# Patient Record
Sex: Male | Born: 1965 | Race: White | Hispanic: No | Marital: Married | Smoking: Former smoker
Health system: Southern US, Community
[De-identification: ages and names within clinical notes are randomized; demographics above are authoritative.]

## PROBLEM LIST (undated history)

## (undated) DIAGNOSIS — M722 Plantar fascial fibromatosis: Secondary | ICD-10-CM

## (undated) DIAGNOSIS — K5792 Diverticulitis of intestine, part unspecified, without perforation or abscess without bleeding: Secondary | ICD-10-CM

## (undated) DIAGNOSIS — I10 Essential (primary) hypertension: Secondary | ICD-10-CM

## (undated) DIAGNOSIS — G4730 Sleep apnea, unspecified: Secondary | ICD-10-CM

## (undated) DIAGNOSIS — E119 Type 2 diabetes mellitus without complications: Secondary | ICD-10-CM

## (undated) HISTORY — PX: DENTAL SURGERY: SHX609

## (undated) HISTORY — PX: FOOT SURGERY: SHX648

## (undated) HISTORY — DX: Plantar fascial fibromatosis: M72.2

## (undated) HISTORY — DX: Type 2 diabetes mellitus without complications: E11.9

## (undated) HISTORY — PX: OTHER SURGICAL HISTORY: SHX169

---

## 2001-03-02 ENCOUNTER — Ambulatory Visit (HOSPITAL_COMMUNITY): Admission: RE | Admit: 2001-03-02 | Discharge: 2001-03-02 | Payer: Self-pay | Admitting: *Deleted

## 2008-02-02 ENCOUNTER — Ambulatory Visit (HOSPITAL_COMMUNITY): Admission: RE | Admit: 2008-02-02 | Discharge: 2008-02-03 | Payer: Self-pay | Admitting: Orthopaedic Surgery

## 2008-09-17 ENCOUNTER — Ambulatory Visit: Payer: Self-pay | Admitting: Psychiatry

## 2008-09-18 ENCOUNTER — Encounter: Admission: RE | Admit: 2008-09-18 | Discharge: 2008-10-15 | Payer: Self-pay | Admitting: Family Medicine

## 2008-10-01 ENCOUNTER — Ambulatory Visit: Payer: Self-pay | Admitting: Psychiatry

## 2008-10-15 ENCOUNTER — Ambulatory Visit: Payer: Self-pay | Admitting: Psychiatry

## 2008-10-19 ENCOUNTER — Encounter: Admission: RE | Admit: 2008-10-19 | Discharge: 2008-10-19 | Payer: Self-pay | Admitting: Family Medicine

## 2008-10-29 ENCOUNTER — Ambulatory Visit: Payer: Self-pay | Admitting: Psychiatry

## 2008-11-12 ENCOUNTER — Ambulatory Visit: Payer: Self-pay | Admitting: Psychiatry

## 2008-11-26 ENCOUNTER — Ambulatory Visit: Payer: Self-pay | Admitting: Psychiatry

## 2011-04-28 NOTE — Op Note (Signed)
NAME:  Dennis Sims, Dennis Sims NO.:  [id number]   MEDICAL RECORD NO.:  [id number]          PATIENT TYPE:  AMB   LOCATION:  SDS                          FACILITY:  [facility]   PHYSICIAN:  Lubertha Basque. Dalldorf, M.D.DATE OF BIRTH:  04/12/1966   DATE OF PROCEDURE:  02/02/2008  DATE OF DISCHARGE:                               OPERATIVE REPORT   PREOPERATIVE DIAGNOSIS:  Bilateral plantar fasciitis.   POSTOPERATIVE DIAGNOSIS:  Bilateral plantar fasciitis.   PROCEDURE:  Bilateral endoscopic plantar fascial release with Topaz.   ANESTHESIA:  General.   ATTENDING SURGEON:  Lubertha Basque. Jerl Santos, M.D.   ASSISTANT:  Lindwood Qua, P.A.   INDICATIONS FOR PROCEDURE:  The patient is a 45 year old man with years  of bilateral heel pain.  This has persisted despite conservative  measures including supervised physical therapy, night splinting,  stretching, and multiple injections.  He has pain which limits his  ability to remain active.  He is offered endoscopic plantar fascial  release with Topaz.  Informed operative consent was obtained after  discussion of possible complications of reaction to anesthesia,  infection, neurovascular injury.   SUMMARY OF FINDINGS AND PROCEDURE:  Under general anesthesia through  endoscopic approaches to both ankles, bilateral plantar fascial surgery  was performed.  We used the Topaz device on both sides to help stimulate  angiogenesis.  A partial release was done from the medial aspect as  well.   DESCRIPTION OF PROCEDURE:  The patient was taken to the operating suite  where general anesthetic was applied without difficulty.  He was  positioned supine and prepped and draped in normal sterile fashion.  After the administration of IV Kefzol, the left ankle was addressed  initially.  Small medial incision was made near the plantar fascial  origin with blunt dissection down to this structure.  A trocar was  placed above this structure and placed out the  heel on the lateral  aspect through a separate stab wound.  The plantar fascia was then  visualized with an endoscope.  We then used the Topaz device to perform  stimulation of this fascia in proximally 20 areas with the probe in  hopes that this would cause some angiogenesis.  I then removed the  arthroscopic equipment.  We placed a small Metzenbaum scissors and  released the medial edge of the plantar fascial origin.  The foot was  irrigated, followed by reapproximation of the wounds loosely with nylon.  We then addressed the right heel in an identical fashion.  Both heels  were dressed with Adaptic and dry gauze and loose Ace wraps.  Estimated  loss and intraoperative fluids can be obtained from anesthesia records.  No tourniquets were placed or utilized.   DISPOSITION:  The patient was extubated in the operating room and taken  to recovery in stable addition.  He was to stay overnight to monitor his  sleep apnea with probable discharge home in the morning.      Lubertha Basque Jerl Santos, M.D.  Electronically Signed     PGD/MEDQ  D:  02/02/2008  T:  02/03/2008  Job:  [id number]

## 2011-04-28 NOTE — Discharge Summary (Signed)
NAME:  Dennis Sims, Dennis Sims NO.:  [id number]   MEDICAL RECORD NO.:  [id number]          PATIENT TYPE:  OIB   LOCATION:  5016                         FACILITY:  [facility]   PHYSICIAN:  Lubertha Basque. Dalldorf, M.D.DATE OF BIRTH:  [birth year]   DATE OF ADMISSION:  02/02/2008  DATE OF DISCHARGE:  02/03/2008                               DISCHARGE SUMMARY   ADMITTING DIAGNOSES:  1. Bilateral plantar fascial chronic pain.  2. Sleep apnea.   DISCHARGE DIAGNOSES:  1. Bilateral plantar fascial chronic pain.  2. Sleep apnea.   OPERATION:  Bilateral plantar fascial release endoscopic.   BRIEF HISTORY:  This is a 45 year old white male patient well-known to  our practice who Dr. Eula Listen had been treating conservatively  for bilateral plantar fasciitis.  He had done oral anti-inflammatory  medicines, orthotics, injections, physical therapy.  The patient failed  all of these and had continued discomfort with standing and walking and  pain with initial weightbearing.  X-rays revealed small spurs on the  plantar fascia and the calcaneus.  Discussed treatment options with the  patient that being plantar fascial releases bilaterally endoscopically.   PERTINENT LABORATORY AND X-RAY FINDINGS:  WBC 9.9, hemoglobin 14.8.   HOSPITAL COURSE:  He was admitted postoperatively and placed on a  variety of p.o. and IM analgesics for pain IV.  PCA Dilaudid pump was  used for pain control.  IV Ancef a gram q.8 h. x3 doses.  The first  morning postop blood pressure 109/64, temperature 97.  Lungs were clear.  Abdomen was soft.  Wounds were benign.  Dressing was changed bilaterally  but he had not gotten out of bed and walked.  We discussed treatment  options in addition with him and that was to have physical therapy walk  him, stand him up with a walker and crutches and then he was discharged.   CONDITION ON DISCHARGE:  Improved.   FOLLOWUP CARE:  He will have a low-sodium/heart-healthy diet.   May  change his dressings daily.  He was given a prescription for Vicodin one  or two q.4-6 h. p.r.n. pain.  Any sign of infection to call [phone],  ice, elevation.  Follow up with Dr. Jerl Santos in about 2 weeks.      Lindwood Qua, P.A.      Lubertha Basque Jerl Santos, M.D.  Electronically Signed    MC/MEDQ  D:  02/03/2008  T:  02/03/2008  Job:  [id number]

## 2011-09-04 LAB — CBC
HCT: 43
Hemoglobin: 14.8
MCHC: 34.5
MCV: 96.6
Platelets: 176
RBC: 4.45
RDW: 12.9
WBC: 9.9

## 2011-10-02 ENCOUNTER — Other Ambulatory Visit: Payer: Self-pay | Admitting: Family Medicine

## 2011-10-02 ENCOUNTER — Ambulatory Visit
Admission: RE | Admit: 2011-10-02 | Discharge: 2011-10-02 | Disposition: A | Discharge status: Home / Self Care | Payer: Private Health Insurance - Indemnity | Source: Ambulatory Visit | Attending: Family Medicine | Admitting: Family Medicine

## 2011-10-02 DIAGNOSIS — R1032 Left lower quadrant pain: Secondary | ICD-10-CM

## 2011-10-02 MED ORDER — IOHEXOL 300 MG/ML  SOLN
125.0000 mL | Freq: Once | INTRAMUSCULAR | Status: AC | PRN
  Administered 2011-10-02: 125 mL via INTRAVENOUS

## 2011-10-07 ENCOUNTER — Other Ambulatory Visit: Payer: Self-pay | Admitting: Family Medicine

## 2011-10-07 DIAGNOSIS — R93429 Abnormal radiologic findings on diagnostic imaging of unspecified kidney: Secondary | ICD-10-CM

## 2011-10-07 DIAGNOSIS — N281 Cyst of kidney, acquired: Secondary | ICD-10-CM

## 2011-10-13 ENCOUNTER — Ambulatory Visit
Admission: RE | Admit: 2011-10-13 | Discharge: 2011-10-13 | Disposition: A | Discharge status: Home / Self Care | Payer: Private Health Insurance - Indemnity | Source: Ambulatory Visit | Attending: Family Medicine | Admitting: Family Medicine

## 2011-10-13 DIAGNOSIS — R93429 Abnormal radiologic findings on diagnostic imaging of unspecified kidney: Secondary | ICD-10-CM

## 2011-10-13 DIAGNOSIS — N281 Cyst of kidney, acquired: Secondary | ICD-10-CM

## 2011-10-13 MED ORDER — GADOBENATE DIMEGLUMINE 529 MG/ML IV SOLN
20.0000 mL | Freq: Once | INTRAVENOUS | Status: AC | PRN
  Administered 2011-10-13: 20 mL via INTRAVENOUS

## 2011-11-10 ENCOUNTER — Ambulatory Visit
Admission: RE | Admit: 2011-11-10 | Discharge: 2011-11-10 | Disposition: A | Discharge status: Home / Self Care | Payer: Private Health Insurance - Indemnity | Source: Ambulatory Visit | Attending: Family Medicine | Admitting: Family Medicine

## 2011-11-10 ENCOUNTER — Other Ambulatory Visit: Payer: Self-pay | Admitting: Family Medicine

## 2011-11-10 DIAGNOSIS — R1032 Left lower quadrant pain: Secondary | ICD-10-CM

## 2011-11-10 MED ORDER — IOHEXOL 300 MG/ML  SOLN
125.0000 mL | Freq: Once | INTRAMUSCULAR | Status: AC | PRN
  Administered 2011-11-10: 125 mL via INTRAVENOUS

## 2012-03-16 ENCOUNTER — Other Ambulatory Visit: Payer: Self-pay | Admitting: Family Medicine

## 2012-03-16 DIAGNOSIS — N281 Cyst of kidney, acquired: Secondary | ICD-10-CM

## 2012-06-17 ENCOUNTER — Ambulatory Visit: Payer: Private Health Insurance - Indemnity | Admitting: Internal Medicine

## 2012-06-17 VITALS — BP 150/74 | HR 73 | Temp 98.3°F | Resp 16 | Ht 77.0 in | Wt 278.2 lb

## 2012-06-17 DIAGNOSIS — H6090 Unspecified otitis externa, unspecified ear: Secondary | ICD-10-CM

## 2012-06-17 DIAGNOSIS — H60399 Other infective otitis externa, unspecified ear: Secondary | ICD-10-CM

## 2012-06-17 MED ORDER — NEOMYCIN-POLYMYXIN-HC 3.5-10000-1 OT SOLN: 3.0000 [drp] | Freq: Four times a day (QID) | OTIC | Status: AC

## 2012-06-17 MED ORDER — AMOXICILLIN-POT CLAVULANATE 875-125 MG PO TABS: 1.0000 | ORAL_TABLET | Freq: Two times a day (BID) | ORAL | Status: AC

## 2012-06-17 NOTE — Progress Notes (Signed)
  Subjective:    Patient ID: Dennis Sims, male    DOB: 1966-02-24, 46 y.o.   MRN: [id number]  HPIComplaining of ear pain for 2 days/hurts to push on ear lobe/no fever/No nasal congestion    Review of Systems     Objective:   Physical Exam Right ear normal Left ear has swollen canal with tender tragus pull and exudate throughout the canal        Assessment & Plan:  External otitis  Cortisporin Otic/Augmentin

## 2012-08-18 ENCOUNTER — Ambulatory Visit (INDEPENDENT_AMBULATORY_CARE_PROVIDER_SITE_OTHER): Payer: Private Health Insurance - Indemnity | Admitting: Emergency Medicine

## 2012-08-18 VITALS — BP 159/89 | HR 83 | Temp 98.0°F | Resp 16 | Ht 75.5 in | Wt 300.0 lb

## 2012-08-18 DIAGNOSIS — H04309 Unspecified dacryocystitis of unspecified lacrimal passage: Secondary | ICD-10-CM

## 2012-08-18 MED ORDER — CIPROFLOXACIN HCL 0.3 % OP SOLN: 1.0000 [drp] | OPHTHALMIC | Status: AC

## 2012-08-18 NOTE — Progress Notes (Signed)
   Date:  08/18/2012   Name:  Dennis Sims   DOB:  Jan 09, 1966   MRN:  [id number] Gender: male Age: 46 y.o.  PCP:  No primary provider on file.    Chief Complaint: Eye Pain   History of Present Illness:  Dennis Sims is a 46 y.o. pleasant patient who presents with the following:  Had a sore "bump;" on his right lower lid yesterday and put clearasil on it and used his CPAP.   This morning awoke and his lower lid was inflammed and red.  No drainage or gluing.  No coryza.  No fever or chills or visual symptoms.   There is no problem list on file for this patient.   No past medical history on file.  No past surgical history on file.  History  Substance Use Topics  . Smoking status: Former Smoker -- 1.0 packs/day for 15 years    Types: Cigarettes    Quit date: 07/13/2012  . Smokeless tobacco: Not on file  . Alcohol Use: Not on file     BEER AND ALCOHOL    No family history on file.  Allergies  Allergen Reactions  . Oxycodone Rash    ITCHING AND UNABLE TO SLEEP        Medication list has been reviewed and updated.  Current Outpatient Prescriptions on File Prior to Visit  Medication Sig Dispense Refill  . Zolpidem Tartrate (AMBIEN PO) Take 10 mg by mouth at bedtime as needed.         Review of Systems: As per HPI, otherwise negative.    Physical Examination: Filed Vitals:   08/18/12 1451  BP: 159/89  Pulse: 83  Temp: 98 F (36.7 C)  Resp: 16   Filed Vitals:   08/18/12 1451  Height: 6' 3.5" (1.918 m)  Weight: 300 lb (136.079 kg)   Body mass index is 37.00 kg/(m^2). Ideal Body Weight: Weight in (lb) to have BMI = 25: 202.3   .GEN: WDWN, NAD, Non-toxic, Alert & Oriented x 3 HEENT: Atraumatic, Normocephalic. PRRERLA EOMI  Conjunctiva and scle Ears and Nose: No external deformity. EXTR: No clubbing/cyanosis/edema NEURO: Normal gait.  PSYCH: Normally interactive. Conversant. Not depressed or anxious appearing.  Calm demeanor.     Assessment and  Plan:  dacryocystitis Ciloxan Follow up as needed  Carmelina Dane, MD

## 2013-04-30 ENCOUNTER — Ambulatory Visit (INDEPENDENT_AMBULATORY_CARE_PROVIDER_SITE_OTHER): Payer: BC Managed Care – PPO | Admitting: Physician Assistant

## 2013-04-30 VITALS — BP 140/100 | HR 88 | Temp 98.5°F | Resp 18 | Ht 75.5 in | Wt 324.0 lb

## 2013-04-30 DIAGNOSIS — R05 Cough: Secondary | ICD-10-CM

## 2013-04-30 DIAGNOSIS — G4700 Insomnia, unspecified: Secondary | ICD-10-CM

## 2013-04-30 DIAGNOSIS — R059 Cough, unspecified: Secondary | ICD-10-CM

## 2013-04-30 DIAGNOSIS — J40 Bronchitis, not specified as acute or chronic: Secondary | ICD-10-CM

## 2013-04-30 MED ORDER — AZITHROMYCIN 250 MG PO TABS: ORAL_TABLET | ORAL | Status: DC

## 2013-04-30 MED ORDER — ZOLPIDEM TARTRATE 10 MG PO TABS: 10.0000 mg | ORAL_TABLET | Freq: Every evening | ORAL | Status: DC | PRN

## 2013-04-30 MED ORDER — BENZONATATE 100 MG PO CAPS: 100.0000 mg | ORAL_CAPSULE | Freq: Three times a day (TID) | ORAL | Status: DC | PRN

## 2013-04-30 MED ORDER — GUAIFENESIN-CODEINE 100-10 MG/5ML PO SOLN: ORAL | Status: DC

## 2013-04-30 NOTE — Progress Notes (Signed)
Patient ID: DEVONTAY CELAYA MRN: [id number], DOB: [birth year], 47 y.o. Date of Encounter: 04/30/2013, 5:35 PM  Primary Physician: Neldon Labella, MD  Chief Complaint:  Chief Complaint  Patient presents with  . Cough    * 4 days- productive in the AM  . Medication Refill    Ambien    HPI: 47 y.o. male presents with a 4 day history of nasal congestion, post nasal drip, sore throat, and cough. Mild sinus pressure. Afebrile. No chills. Nasal congestion thick and green/yellow. Cough is mildly productive of green/yellow sputum and not associated with time of day. No shortness of breath or wheezing. No increase in pillow usage. Uses two pillows and has for years. No lower extremity swelling. Has tried OTC cold preps without success. No GI complaints. Appetite normal. Still smoking. Not yet ready to quit. No sick contacts, recent antibiotics, or recent travels. No leg trauma, sedentary periods, or h/o cancer.  He also requests a refill on Ambien 10 mg. Does not take regularly. Usually only takes if he has a big meeting the next morning.   No past medical history on file.   Home Meds: Prior to Admission medications   Medication Sig Start Date End Date Taking? Authorizing Provider                                Allergies:  Allergies  Allergen Reactions  . Oxycodone Rash    ITCHING AND UNABLE TO SLEEP        History   Social History  . Marital Status: Married    Spouse Name: N/A    Number of Children: N/A  . Years of Education: N/A   Occupational History  . Not on file.   Social History Main Topics  . Smoking status: Current Some Day Smoker -- 1.00 packs/day for 15 years    Types: Cigarettes    Last Attempt to Quit: 07/13/2012  . Smokeless tobacco: Not on file  . Alcohol Use: Yes     Comment: BEER AND ALCOHOL  . Drug Use: No  . Sexually Active: Not on file   Other Topics Concern  . Not on file   Social History Narrative  . No narrative on file     Review of  Systems: Constitutional: negative for chills, fever, night sweats or weight changes HEENT: see above Cardiovascular: negative for chest pain or palpitations Respiratory: positive for cough. negative for hemoptysis, wheezing, or shortness of breath Abdominal: negative for abdominal pain, nausea, vomiting or diarrhea Dermatological: negative for rash Neurologic: negative for headache   Physical Exam: Blood pressure 140/100, pulse 88, temperature 98.5 F (36.9 C), temperature source Oral, resp. rate 18, height 6' 3.5" (1.918 m), weight 324 lb (146.965 kg), SpO2 96.00%., Body mass index is 39.95 kg/(m^2). General: Well developed, well nourished, in no acute distress. Head: Normocephalic, atraumatic, eyes without discharge, sclera non-icteric, nares are congested. Bilateral auditory canals clear, TM's are without perforation, pearly grey with reflective cone of light bilaterally. No sinus TTP. Oral cavity moist, dentition normal. Posterior pharynx with post nasal drip and mild erythema. No peritonsillar abscess or tonsillar exudate. Uvula midline. Neck: Supple. No thyromegaly. Full ROM. No lymphadenopathy. Lungs: Coarse breath sounds bilaterally without wheezes, rales, or rhonchi. Breathing is unlabored.  Heart: RRR with S1 S2. No murmurs, rubs, or gallops appreciated. Msk:  Strength and tone normal for age. Extremities: No clubbing or cyanosis. No edema. Neuro: Alert  and oriented X 3. Moves all extremities spontaneously. CNII-XII grossly in tact. Psych:  Responds to questions appropriately with a normal affect.     ASSESSMENT AND PLAN:  47 y.o. male with bronchitis, cough, and insomnia 1) Bronchitis and cough -Azithromycin 250 MG #6 2 po first day then 1 po next 4 days no RF -Guaifenesin/codeine 100/10 mg/79mL 1 tsp po q 4-6 hours prn cough #120 mL no RF, SED -Tessalon Perles 100 mg 1 po tid prn cough #30 no RF  -Mucinex -Tylenol/Motrin prn -Stop smoking -Rest/fluids -RTC  precautions -RTC 3-5 days if no improvement  2) Insomnia -Ambien 10 mg 1 po qhs #30 no RF -May call for further refills  Signed, Eula Listen, PA-C 04/30/2013 5:35 PM

## 2013-09-14 ENCOUNTER — Other Ambulatory Visit: Payer: Self-pay | Admitting: Physician Assistant

## 2013-09-14 DIAGNOSIS — R319 Hematuria, unspecified: Secondary | ICD-10-CM

## 2013-09-15 ENCOUNTER — Ambulatory Visit
Admission: RE | Admit: 2013-09-15 | Discharge: 2013-09-15 | Disposition: A | Discharge status: Home / Self Care | Payer: BC Managed Care – PPO | Source: Ambulatory Visit | Attending: Physician Assistant | Admitting: Physician Assistant

## 2013-09-15 DIAGNOSIS — R319 Hematuria, unspecified: Secondary | ICD-10-CM

## 2013-10-06 ENCOUNTER — Other Ambulatory Visit: Payer: Self-pay | Admitting: Gastroenterology

## 2013-10-06 DIAGNOSIS — R109 Unspecified abdominal pain: Secondary | ICD-10-CM

## 2013-10-11 ENCOUNTER — Ambulatory Visit
Admission: RE | Admit: 2013-10-11 | Discharge: 2013-10-11 | Disposition: A | Discharge status: Home / Self Care | Payer: BC Managed Care – PPO | Source: Ambulatory Visit | Attending: Gastroenterology | Admitting: Gastroenterology

## 2013-10-11 DIAGNOSIS — R109 Unspecified abdominal pain: Secondary | ICD-10-CM

## 2013-10-11 MED ORDER — IOHEXOL 300 MG/ML  SOLN
125.0000 mL | Freq: Once | INTRAMUSCULAR | Status: AC | PRN
  Administered 2013-10-11: 125 mL via INTRAVENOUS

## 2013-11-19 ENCOUNTER — Emergency Department (HOSPITAL_COMMUNITY)
Admission: EM | Admit: 2013-11-19 | Discharge: 2013-11-19 | Disposition: A | Discharge status: Home / Self Care | Payer: BC Managed Care – PPO | Attending: Emergency Medicine | Admitting: Emergency Medicine

## 2013-11-19 ENCOUNTER — Emergency Department (HOSPITAL_COMMUNITY): Payer: BC Managed Care – PPO

## 2013-11-19 ENCOUNTER — Encounter (HOSPITAL_COMMUNITY): Payer: Self-pay | Admitting: Emergency Medicine

## 2013-11-19 DIAGNOSIS — R6883 Chills (without fever): Secondary | ICD-10-CM | POA: Insufficient documentation

## 2013-11-19 DIAGNOSIS — R197 Diarrhea, unspecified: Secondary | ICD-10-CM | POA: Insufficient documentation

## 2013-11-19 DIAGNOSIS — R112 Nausea with vomiting, unspecified: Secondary | ICD-10-CM | POA: Insufficient documentation

## 2013-11-19 DIAGNOSIS — Z8719 Personal history of other diseases of the digestive system: Secondary | ICD-10-CM | POA: Insufficient documentation

## 2013-11-19 DIAGNOSIS — R109 Unspecified abdominal pain: Secondary | ICD-10-CM

## 2013-11-19 DIAGNOSIS — R1031 Right lower quadrant pain: Secondary | ICD-10-CM | POA: Insufficient documentation

## 2013-11-19 DIAGNOSIS — R1032 Left lower quadrant pain: Secondary | ICD-10-CM | POA: Insufficient documentation

## 2013-11-19 DIAGNOSIS — G4733 Obstructive sleep apnea (adult) (pediatric): Secondary | ICD-10-CM | POA: Insufficient documentation

## 2013-11-19 DIAGNOSIS — F17200 Nicotine dependence, unspecified, uncomplicated: Secondary | ICD-10-CM | POA: Insufficient documentation

## 2013-11-19 HISTORY — DX: Sleep apnea, unspecified: G47.30

## 2013-11-19 HISTORY — DX: Diverticulitis of intestine, part unspecified, without perforation or abscess without bleeding: K57.92

## 2013-11-19 LAB — COMPREHENSIVE METABOLIC PANEL
AST: 20 U/L (ref 0–37)
Albumin: 3.9 g/dL (ref 3.5–5.2)
Alkaline Phosphatase: 53 U/L (ref 39–117)
BUN: 17 mg/dL (ref 6–23)
CO2: 23 mEq/L (ref 19–32)
Chloride: 100 mEq/L (ref 96–112)
Creatinine, Ser: 1.34 mg/dL (ref 0.50–1.35)
GFR calc non Af Amer: 61 mL/min — ABNORMAL LOW (ref >90)
Potassium: 3.6 mEq/L (ref 3.5–5.1)
Total Bilirubin: 1.9 mg/dL — ABNORMAL HIGH (ref 0.3–1.2)

## 2013-11-19 LAB — CBC WITH DIFFERENTIAL/PLATELET
Basophils Absolute: 0 10*3/uL (ref 0.0–0.1)
Basophils Relative: 0 % (ref 0–1)
Eosinophils Absolute: 0.1 10*3/uL (ref 0.0–0.7)
Eosinophils Relative: 1 % (ref 0–5)
MCH: 34.2 pg — ABNORMAL HIGH (ref 26.0–34.0)
MCV: 95 fL (ref 78.0–100.0)
Neutrophils Relative %: 80 % — ABNORMAL HIGH (ref 43–77)
Platelets: 123 10*3/uL — ABNORMAL LOW (ref 150–400)
RBC: 4.83 MIL/uL (ref 4.22–5.81)
RDW: 12.4 % (ref 11.5–15.5)

## 2013-11-19 MED ORDER — HYDROCODONE-ACETAMINOPHEN 7.5-325 MG PO TABS: 1.0000 | ORAL_TABLET | Freq: Four times a day (QID) | ORAL | Status: DC | PRN

## 2013-11-19 MED ORDER — TRAMADOL HCL 50 MG PO TABS: 50.0000 mg | ORAL_TABLET | Freq: Four times a day (QID) | ORAL | Status: DC | PRN

## 2013-11-19 MED ORDER — FENTANYL CITRATE 0.05 MG/ML IJ SOLN
100.0000 ug | Freq: Once | INTRAMUSCULAR | Status: AC
  Administered 2013-11-19: 100 ug via INTRAVENOUS
  Filled 2013-11-19: qty 2

## 2013-11-19 MED ORDER — ONDANSETRON HCL 4 MG/2ML IJ SOLN
4.0000 mg | Freq: Once | INTRAMUSCULAR | Status: AC
  Administered 2013-11-19: 4 mg via INTRAVENOUS
  Filled 2013-11-19: qty 2

## 2013-11-19 NOTE — ED Notes (Signed)
I gave patient urinal he will call when he goes

## 2013-11-19 NOTE — ED Notes (Signed)
Patient does not have to urinate, he says he is too weak.

## 2013-11-19 NOTE — ED Provider Notes (Signed)
I saw and evaluated the patient, reviewed the resident's note and I agree with the findings and plan.   .Face to face Exam:  General:  Awake HEENT:  Atraumatic Resp:  Normal effort Abd:  Nondistended Neuro:No focal weakness On  Nelia Shi, MD 11/19/13 1620

## 2013-11-19 NOTE — ED Provider Notes (Signed)
CSN: [id number]     Arrival date & time 11/19/13  1324 History   First MD Initiated Contact with Patient 11/19/13 1356     No chief complaint on file.  (Consider location/radiation/quality/duration/timing/severity/associated sxs/prior Treatment) HPI Comments: 47 year old male with a PMH of diverticular disease and OSA presenting with LLQ pain.  He describes sharp pain that worsened in intensity after a colonoscopy two days ago.  He had two polyps removed during the procedure.  He has been eating but vomited the night of the procedure and again last night.  He reports loose, non-bloody bowel movements.  He also reports chills the past two nights.  He contacted his gastroenterologist's office and was advised to go to the ED.        Past Medical History  Diagnosis Date  . Sleep apnea   . Diverticulitis    Past Surgical History  Procedure Laterality Date  . Colonscopy    . Dental surgery    . Foot surgery     No family history on file. History  Substance Use Topics  . Smoking status: Current Some Day Smoker -- 1.00 packs/day for 15 years    Types: Cigarettes    Last Attempt to Quit: 07/13/2012  . Smokeless tobacco: Not on file  . Alcohol Use: Yes     Comment: BEER AND ALCOHOL    Review of Systems  Constitutional: Positive for chills.  Respiratory: Negative for shortness of breath.   Cardiovascular: Negative for chest pain.  Gastrointestinal: Positive for nausea, vomiting and abdominal pain. Negative for constipation and blood in stool.  Genitourinary: Negative for dysuria and hematuria.    Allergies  Oxycodone  Home Medications   Current Outpatient Rx  Name  Route  Sig  Dispense  Refill  . acetaminophen (TYLENOL) 500 MG tablet   Oral   Take 1,000 mg by mouth every 6 (six) hours as needed.         . zolpidem (AMBIEN) 10 MG tablet   Oral   Take 10 mg by mouth at bedtime as needed for sleep.         . polyethylene glycol-electrolytes (NULYTELY/GOLYTELY) 420 G  solution   Oral   Take 4,000 mLs by mouth once.         Marland Kitchen EXPIRED: zolpidem (AMBIEN) 10 MG tablet   Oral   Take 1 tablet (10 mg total) by mouth at bedtime as needed for sleep.   30 tablet   0    BP 118/72  Pulse 66  Temp(Src) 98.1 F (36.7 C) (Oral)  Resp 24  SpO2 94% Physical Exam  Constitutional: He is oriented to person, place, and time. He appears well-developed and well-nourished. No distress.  HENT:  Head: Normocephalic and atraumatic.  Mouth/Throat: Oropharynx is clear and moist. No oropharyngeal exudate.  Eyes: EOM are normal. Pupils are equal, round, and reactive to light. No scleral icterus.  Neck: Neck supple.  Cardiovascular: Normal rate, regular rhythm and normal heart sounds.   Pulmonary/Chest: Effort normal and breath sounds normal. No respiratory distress. He has no wheezes. He has no rales.  Abdominal: Soft. Bowel sounds are normal. He exhibits no distension. There is tenderness. There is no rebound and no guarding.  Patient tolerated gurney being rocked back and forth.  + lower abdomen (LLQ > RLQ) extremely tender to light palpation.  Musculoskeletal: Normal range of motion. He exhibits no edema and no tenderness.  Neurological: He is alert and oriented to person, place, and time.  Skin: Skin is warm. He is not diaphoretic.  Psychiatric: He has a normal mood and affect. His behavior is normal.    ED Course  Procedures (including critical care time) Labs Review Labs Reviewed  COMPREHENSIVE METABOLIC PANEL - Abnormal; Notable for the following:    Glucose, Bld 118 (*)    Total Bilirubin 1.9 (*)    GFR calc non Af Amer 61 (*)    GFR calc Af Amer 70 (*)    All other components within normal limits  CBC WITH DIFFERENTIAL - Abnormal; Notable for the following:    MCH 34.2 (*)    Platelets 123 (*)    Neutrophils Relative % 80 (*)    Lymphocytes Relative 11 (*)    All other components within normal limits  URINALYSIS, ROUTINE W REFLEX MICROSCOPIC    Imaging Review No results found.  EKG Interpretation   None       MDM  47 year old male with a PMH of diverticular disease presenting with LLQ pain two days after colonoscopy.  Concerning for perforation.  Fentanyl and Zofran for symptom control in ED.  CT abdomen and pelvis without contrast.  CT negative for free air/perforation, bowel obstruction or appendicitis.  Consult to Dr. Madilyn Fireman regarding the patient's management.  Dr. Madilyn Fireman advises that as long as the patient is hemodynamically stable he can be discharged with pain control (no NSAIDS) and instructed to call [facility] tomorrow regarding his progress.  He is stable for discharge home.  Evelena Peat, DO 11/19/13 [phone]

## 2013-11-19 NOTE — ED Notes (Signed)
States that he had a colonoscopy on Friday with removal of 2 polyps. States that he is experiencing LLQ pain that is gradually worse since Saturday morning. States that he vomited last pm with non today. States that he is very tender to touch and has decreased appetite.

## 2013-11-19 NOTE — ED Notes (Signed)
Patient transported to CT 

## 2013-11-19 NOTE — ED Notes (Signed)
Pt returned from CT °

## 2013-12-22 ENCOUNTER — Ambulatory Visit (INDEPENDENT_AMBULATORY_CARE_PROVIDER_SITE_OTHER): Payer: BC Managed Care – PPO | Admitting: Podiatrist

## 2013-12-22 ENCOUNTER — Encounter: Payer: Self-pay | Admitting: Podiatrist

## 2013-12-22 VITALS — BP 137/114 | HR 81 | Resp 18

## 2013-12-22 DIAGNOSIS — B351 Tinea unguium: Secondary | ICD-10-CM

## 2013-12-22 MED ORDER — TERBINAFINE HCL 250 MG PO TABS: 250.0000 mg | ORAL_TABLET | Freq: Every day | ORAL | Status: DC

## 2013-12-22 NOTE — Patient Instructions (Signed)
Onychomycosis/Fungal Toenails  WHAT IS IT? An infection that lies within the keratin of your nail plate that is caused by a fungus.  WHY ME? Fungal infections affect all ages, sexes, races, and creeds.  There may be many factors that predispose you to a fungal infection such as age, coexisting medical conditions such as diabetes, or an autoimmune disease; stress, medications, fatigue, genetics, etc.  Bottom line: fungus thrives in a warm, moist environment and your shoes offer such a location.  IS IT CONTAGIOUS? Theoretically, yes.  You do not want to share shoes, nail clippers or files with someone who has fungal toenails.  Walking around barefoot in the same room or sleeping in the same bed is unlikely to transfer the organism.  It is important to realize, however, that fungus can spread easily from one nail to the next on the same foot.  HOW DO WE TREAT THIS?  There are several ways to treat this condition.  Treatment may depend on many factors such as age, medications, pregnancy, liver and kidney conditions, etc.  It is best to ask your doctor which options are available to you.  1. No treatment.   Unlike many other medical concerns, you can live with this condition.  However for many people this can be a painful condition and may lead to ingrown toenails or a bacterial infection.  It is recommended that you keep the nails cut short to help reduce the amount of fungal nail. 2. Topical treatment.  These range from herbal remedies to prescription strength nail lacquers.  About 40-50% effective, topicals require twice daily application for approximately 9 to 12 months or until an entirely new nail has grown out.  The most effective topicals are medical grade medications available through physicians offices. 3. Oral antifungal medications.  With an 80-90% cure rate, the most common oral medication requires 3 to 4 months of therapy and stays in your system for a year as the new nail grows out.  Oral  antifungal medications do require blood work to make sure it is a safe drug for you.  A liver function panel will be performed prior to starting the medication and after the first month of treatment.  It is important to have the blood work performed to avoid any harmful side effects.  In general, this medication safe but blood work is required. 4. Permanent Nail Avulsion.  Removing the entire nail so that a new nail will not grow back. 

## 2013-12-22 NOTE — Progress Notes (Signed)
   Subjective:    Patient ID: Dennis Sims, male    DOB: 1966/11/07, 48 y.o.   MRN: [id number]  HPI I have two ingrown toenails on both feet and been going on for years and trimmed them myself and have pedicures and hurts with shoes and pressure and the nails are curling in some and I have some heel pain    Review of Systems  Constitutional: Negative.   HENT: Negative.   Eyes: Negative.   Respiratory: Negative.   Cardiovascular: Negative.   Gastrointestinal: Negative.   Endocrine: Negative.   Genitourinary: Negative.   Musculoskeletal: Negative.   Skin: Negative.   Allergic/Immunologic: Negative.   Neurological: Negative.   Hematological: Negative.   Psychiatric/Behavioral: Negative.        Objective:   Physical Exam  Neurovascular status is intact with palpable symmetric pedal pulses and neurological sensation intact. Bilateral hallux nails appear to have thickness and discoloration present which is causing incurvation on the medial and lateral nail borders which is also causing pain. The central aspect of the toenail appears to be most affected and uncomfortable. Musculoskeletal examination reveals good muscle strength tone and stability bilateral.      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 This may allow the toenails to lay down better and stop being ingrown. We are going to try this first and if this is not beneficial we can remove part of the toenails permanently. A blood tests will be ordered in Lamisil will also be ordered for him. He will take this medication and we will call to let him know if the blood tests is abnormal. See him back at the completion of the medication to reassess

## 2013-12-26 LAB — HEPATIC FUNCTION PANEL
ALBUMIN: 4.4 g/dL (ref 3.5–5.2)
ALK PHOS: 56 U/L (ref 39–117)
ALT: 26 U/L (ref 0–53)
AST: 19 U/L (ref 0–37)
BILIRUBIN INDIRECT: 0.6 mg/dL (ref 0.0–0.9)
Bilirubin, Direct: 0.1 mg/dL (ref 0.0–0.3)
TOTAL PROTEIN: 7 g/dL (ref 6.0–8.3)
Total Bilirubin: 0.7 mg/dL (ref 0.3–1.2)

## 2013-12-27 ENCOUNTER — Telehealth: Payer: Self-pay | Admitting: *Deleted

## 2013-12-27 NOTE — Telephone Encounter (Signed)
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             His labs look great-- tell him its OK to take the Lamisil for the toenails.  Tell him to give it 4 months and if he isn't seeing improvement, to let me look at them for him again.              Thanks!!  E      ----- Message -----         From: [facility]         Sent: 12/26/2013  12:31 AM           To: Marlowe AschoffKathryn Egerton, DPM                   ------

## 2013-12-27 NOTE — Telephone Encounter (Signed)
Per Dr. Irving ShowsEgerton, I called and spoke with patient's wife and informed her that his labs were okay.  He can start taking the Lamisil.  If he doesn't notice any change in 4 months, schedule an appointment with Dr. Irving ShowsEgerton.

## 2014-01-03 ENCOUNTER — Telehealth: Payer: Self-pay | Admitting: *Deleted

## 2014-01-03 NOTE — Telephone Encounter (Signed)
Dr Irving ShowsEgerton states 12/25/2013 labs were within normal ranges and pt can take Lamisil.

## 2014-02-26 ENCOUNTER — Ambulatory Visit (INDEPENDENT_AMBULATORY_CARE_PROVIDER_SITE_OTHER): Payer: BC Managed Care – PPO | Admitting: Emergency Medicine

## 2014-02-26 VITALS — BP 158/86 | HR 71 | Temp 98.4°F | Resp 16 | Ht 76.0 in | Wt 309.0 lb

## 2014-02-26 DIAGNOSIS — J018 Other acute sinusitis: Secondary | ICD-10-CM

## 2014-02-26 MED ORDER — AMOXICILLIN-POT CLAVULANATE 875-125 MG PO TABS: 1.0000 | ORAL_TABLET | Freq: Two times a day (BID) | ORAL | Status: DC

## 2014-02-26 NOTE — Progress Notes (Signed)
[facility] [address], [place] [address] [phone]  Date:  02/26/2014   Name:  Dennis Sims Lundquist   DOB:  Apr 02, 1966   MRN:  [id number]  PCP:  Neldon LabellaMILLER,LISA LYNN, MD    Chief Complaint: Cough and Sinus Congestion   History of Present Illness:  Dennis Sims Jakes is a 48 y.o. very pleasant male patient who presents with the following:  Ill with a cough that is largely nonproductive but has some purulent sputum.  Has clear nasal drainage and sinus pressure.  Has a headache.  Some post nasal drip.  No wheezing or shortness of breath. No fever or chills.  No nausea or vomiting or stool change.  Ill for a week.  No improvement with over the counter medications or other home remedies. Denies other complaint or health concern today.   There are no active problems to display for this patient.   Past Medical History  Diagnosis Date  . Sleep apnea   . Diverticulitis     Past Surgical History  Procedure Laterality Date  . Colonscopy    . Dental surgery    . Foot surgery      History  Substance Use Topics  . Smoking status: Current Some Day Smoker -- 1.00 packs/day for 15 years    Types: Cigarettes    Last Attempt to Quit: 07/13/2012  . Smokeless tobacco: Not on file  . Alcohol Use: Yes     Comment: BEER AND ALCOHOL    History reviewed. No pertinent family history.  Allergies  Allergen Reactions  . Oxycodone Rash    ITCHING AND UNABLE TO SLEEP        Medication list has been reviewed and updated.  Current Outpatient Prescriptions on File Prior to Visit  Medication Sig Dispense Refill  . zolpidem (AMBIEN) 10 MG tablet Take 10 mg by mouth at bedtime as needed for sleep.      Marland Kitchen. acetaminophen (TYLENOL) 500 MG tablet Take 1,000 mg by mouth every 6 (six) hours as needed.      Marland Kitchen. amoxicillin-clavulanate (AUGMENTIN) 875-125 MG per tablet       . buPROPion (WELLBUTRIN XL) 300 MG 24 hr tablet       . ciprofloxacin (CIPRO) 500 MG tablet       .  clotrimazole-betamethasone (LOTRISONE) cream       . HYDROcodone-acetaminophen (NORCO) 7.5-325 MG per tablet Take 1 tablet by mouth every 6 (six) hours as needed for severe pain.  12 tablet  0  . metroNIDAZOLE (FLAGYL) 500 MG tablet       . polyethylene glycol-electrolytes (NULYTELY/GOLYTELY) 420 G solution Take 4,000 mLs by mouth once.      Mordecai Maes. TACLONEX external suspension       . terbinafine (LAMISIL) 250 MG tablet Take 1 tablet (250 mg total) by mouth daily.  90 tablet  0  . traMADol (ULTRAM) 50 MG tablet Take 1-2 tablets (50-100 mg total) by mouth every 6 (six) hours as needed for moderate pain.  28 tablet  0  . zolpidem (AMBIEN) 10 MG tablet Take 1 tablet (10 mg total) by mouth at bedtime as needed for sleep.  30 tablet  0   No current facility-administered medications on file prior to visit.    Review of Systems:  As per HPI, otherwise negative.    Physical Examination: Filed Vitals:   02/26/14 1444  BP: 158/86  Pulse: 71  Temp: 98.4 F (36.9 C)  Resp: 16  Filed Vitals:   02/26/14 1444  Height: 6\' 4"  (1.93 m)  Weight: 309 lb (140.161 kg)   Body mass index is 37.63 kg/(m^2). Ideal Body Weight: Weight in (lb) to have BMI = 25: 205  GEN: WDWN, NAD, Non-toxic, A & O x 3 HEENT: Atraumatic, Normocephalic. Neck supple. No masses, No LAD. Ears and Nose: No external deformity.  Purulent nasal drainage CV: RRR, No M/G/R. No JVD. No thrill. No extra heart sounds. PULM: CTA B, no wheezes, crackles, rhonchi. No retractions. No resp. distress. No accessory muscle use. ABD: S, NT, ND, +BS. No rebound. No HSM. EXTR: No c/c/e NEURO Normal gait.  PSYCH: Normally interactive. Conversant. Not depressed or anxious appearing.  Calm demeanor.    Assessment and Plan: Sinusitis augmentin  Signed,  Phillips Odor, MD

## 2014-02-26 NOTE — Patient Instructions (Signed)

## 2014-04-20 ENCOUNTER — Ambulatory Visit (INDEPENDENT_AMBULATORY_CARE_PROVIDER_SITE_OTHER): Payer: BC Managed Care – PPO | Admitting: Podiatrist

## 2014-04-20 ENCOUNTER — Encounter: Payer: Self-pay | Admitting: Podiatrist

## 2014-04-20 VITALS — BP 145/69 | HR 70 | Resp 18

## 2014-04-20 DIAGNOSIS — M79609 Pain in unspecified limb: Secondary | ICD-10-CM

## 2014-04-20 DIAGNOSIS — B351 Tinea unguium: Secondary | ICD-10-CM

## 2014-04-20 MED ORDER — TERBINAFINE HCL 250 MG PO TABS: 250.0000 mg | ORAL_TABLET | Freq: Every day | ORAL | Status: DC

## 2014-04-20 NOTE — Patient Instructions (Signed)
Endoscopic Plantar Fasciotomy On the underside of the foot and heel is a tight band of tissue called the plantar fascia. Sometimes the plantar fascia become inflamed (the body's way of reacting to injury, overuse or infection) which produces pain. The condition is known as plantar fasciitis.  One way to treat plantar fasciitis is through an endoscopic plantar fasciotomy. This is surgery to reduce the tension on the plantar fascia. However, it is a minimally invasive surgery because there will be no large incision. Instead, the surgeon inserts a thin, flexible tube through a small (1/16th of an inch (1.59 mm)) cut in your skin. The surgeon can examine and release the fascia through this tube. Recovery from an endoscopic fasciotomy is usually less painful and faster than from open surgery. LET YOUR CAREGIVER KNOW ABOUT:  Any allergies.  All medications you are taking, including:  Herbs, eyedrops, over-the-counter medications and creams.  Blood thinners (anticoagulants) or other drugs that could affect blood clotting.  Use of steroids (by mouth or as creams).  Previous problems with anesthetics, including local anesthetics.  Possibility of pregnancy, if this applies.  Any history of blood clots.  Any history of bleeding or other blood problems.  Previous surgery.  Smoking history.  Other health problems.  Family history of anesthetic problems RISKS AND COMPLICATIONS  Short-term possibilities include:  Excessive bleeding.  Pain.  Loss of feeling (numbness) at the site of the incision.  Hematoma, a pooling of blood in the wound.  Infection.  Slow resolution of the symptoms. Longer-term possibilities include:  Scarring.  A return of the condition that led to fasciotomy.  Damage to nerves in the area.  Weakness in your foot.  Need for additional surgery. BEFORE THE PROCEDURE  Ask whether you need to get shoes that will support your heel and arch while you  recover.  7 to 10 days before the surgery, stop using aspirin and non-steroidal anti-inflammatory drugs (NSAIDs) for pain relief. This includes prescription drugs and over-the-counter drugs such as ibuprofen and naproxen.  If you take blood-thinners, ask your healthcare provider when you should stop taking them.  Do not eat or drink for about 8 hours before your surgery.  You might be asked to shower or wash with a special antibacterial soap before the procedure.  Arrive 1-2 hours before the surgery, or whenever your surgeon recommends. This will give you time to check in and fill out any needed paperwork.  If your surgery is an outpatient procedure, you will be able to go home the same day. Make arrangements in advance for someone to drive you home. PROCEDURE  You may be given general anesthesia (you will be asleep), regional anesthesia (your leg will be numbed) or local anesthesia (just the area around the fascia will be numbed). With regional and local anesthesia you will be given medication to make you groggy but awake during the procedure.  Your foot will be cleaned and sterilized.  The surgeon will make a cut (incision) on the side of your heel. Then a thin tube that contains a tiny camera will be inserted into the space. The camera makes it possible for the surgeon to see what is happening inside your foot.  The surgeon will work through this tube to release the fascia.  The tube will be removed, and dressing will be applied to the incisions. AFTER THE PROCEDURE After the procedure, you will be taken to another room to recover. People usually go home the same day. Before leaving, make sure   you have detailed instructions on how to care for the incision. Also, you may be given crutches and shown how to use them. Ask your surgeon whether physical therapy will be needed.  HOME CARE INSTRUCTIONS   Take any prescription medication for pain and/or nausea that your surgeon prescribes.  Follow the directions carefully and take all of the medication.  Ask your surgeon whether you can take over-the-counter medicines for pain, discomfort or fever. Do not take aspirin unless your healthcare provider says to. Aspirin can increase the chances of bleeding.  You may need to put ice on your foot for 10 to 15 minutes each day for several days.  While you are resting, keep your foot elevated above the level of your heart.  Do not get the incisions wet for the first few days after surgery (or until the surgeon tells you it is OK).  Avoid standing or walking for long periods. Your healthcare provider will tell you when you are clear to resume normal activity. If your job requires a lot of standing or walking, ask to be assigned to a less active position for about 8 weeks.  When you are up and about, wear shoes with a supportive heel and arch support. Soft running shoes may be recommended for the first two weeks of recovery. SEEK MEDICAL CARE IF:   The wound becomes red or swollen.  The wound leaks fluid or blood.  Your pain increases.  You become nauseous or vomit for more than two days after the surgery.  You have pain or difficulty moving your foot.  You develop a fever of more than 100.5 F (38.1 C). SEEK IMMEDIATE MEDICAL CARE IF:   Your leg or foot starts to swell.  You develop a fever of 102.0 F (38.9 C) or higher. Document Released: 09/27/2009 Document Revised: 02/22/2012 Document Reviewed: 09/27/2009 [facility] Patient Information 2014 [employer] [place] [place].

## 2014-04-20 NOTE — Progress Notes (Signed)
   Subjective:    Patient ID: Dennis Sims, male    DOB: August 12, 1966, 48 y.o.   MRN: [id number]  HPI Comments: Pt states he didn't Lamisil filled, but did get the bloodwork performed.     Review of Systems     Objective:   Physical Exam Neurovascular status intact and unchanged. Patient's toenails #1 bilateral are elongated, thickened, discolored, dystrophic and clinically mycotic bilateral. They're severely incurvated and painful at times. Debridement does help.       Assessment & Plan:  Mycotic hallux nails, painful  Debridement carried out today. His labs are great and therefore I recommended taking the Lamisil. I wrote her prescription for this as well today. I will see him back in 3 months for followup we will monitor the Lamisil as well as the appearance of the toenails. We also briefly discussed endoscopic plantar fascial release of bilateral heels. He has painful plantar fasciitis and wants to have something done to it his tried extensive conservative treatment and nothing has ever worked. He also states he has orthotics.

## 2014-07-19 ENCOUNTER — Ambulatory Visit (INDEPENDENT_AMBULATORY_CARE_PROVIDER_SITE_OTHER): Payer: BC Managed Care – PPO | Admitting: Podiatrist

## 2014-07-19 DIAGNOSIS — M79673 Pain in unspecified foot: Secondary | ICD-10-CM

## 2014-07-19 DIAGNOSIS — B351 Tinea unguium: Secondary | ICD-10-CM

## 2014-07-19 DIAGNOSIS — M79609 Pain in unspecified limb: Secondary | ICD-10-CM

## 2014-07-19 NOTE — Progress Notes (Signed)
HPI: Patient presents today for follow up of diabetic foot and nail care. Past medical history, meds, and allergies reviewed. Patient states blood sugar is under good  control.   Objective:   Objective:  Patients chart is reviewed.  Vascular status reveals pedal pulses noted at  2 out of 4 dp and pt bilateral .  Neurological sensation is Normal to [facility] monofilament bilateral at 5/5 sites bilateral.  Dermatological exam reveals  absence of pre ulcerative/ hyperkeratotic lesions.   Toenails are elongated, incurvated, discolored, dystrophic with ingrown deformity present. He is using jublia on bilateral great toenails with minimal improvement   Assessment: Diabetes with symptomatic mycotic toenails.  Plan: Discussed treatment options and alternatives. Debrided nails without complication. Recommended continued use of Jublia. Return appointment recommended at routine intervals of 3 months.   Marlowe AschoffKathryn Dasia Guerrier, DPM

## 2014-08-01 ENCOUNTER — Telehealth: Payer: Self-pay | Admitting: *Deleted

## 2014-08-01 NOTE — Telephone Encounter (Signed)
She's treating me for toenail fungus and I am out of my prescription.  It is Terbinafine.  I don't know if she wants to refill that and continue with this prescription.  Let me know one way or another.  [facility]

## 2014-08-01 NOTE — Telephone Encounter (Signed)
I left him a message that normally only a 90 day supply is given.  Continue to use the Jublia.  Please call and schedule an appointment with Dr. Irving ShowsEgerton for a 2 month follow-up.

## 2014-08-02 NOTE — Telephone Encounter (Signed)
Correct-- a 90 day supply was written in may so he does not need a refill of this medication.  Continue jublia.  Thanks!

## 2014-11-05 ENCOUNTER — Other Ambulatory Visit: Payer: Self-pay | Admitting: Family Medicine

## 2014-11-05 DIAGNOSIS — N289 Disorder of kidney and ureter, unspecified: Secondary | ICD-10-CM

## 2014-11-07 ENCOUNTER — Ambulatory Visit
Admission: RE | Admit: 2014-11-07 | Discharge: 2014-11-07 | Disposition: A | Discharge status: Home / Self Care | Payer: BC Managed Care – PPO | Source: Ambulatory Visit | Attending: Family Medicine | Admitting: Family Medicine

## 2014-11-07 DIAGNOSIS — N289 Disorder of kidney and ureter, unspecified: Secondary | ICD-10-CM

## 2015-02-07 ENCOUNTER — Ambulatory Visit (INDEPENDENT_AMBULATORY_CARE_PROVIDER_SITE_OTHER): Payer: 59 | Admitting: Podiatrist

## 2015-02-07 ENCOUNTER — Encounter: Payer: Self-pay | Admitting: Podiatrist

## 2015-02-07 DIAGNOSIS — M79673 Pain in unspecified foot: Secondary | ICD-10-CM

## 2015-02-07 DIAGNOSIS — B351 Tinea unguium: Secondary | ICD-10-CM

## 2015-02-07 NOTE — Progress Notes (Signed)
HPI: Patient presents today for follow up of diabetic foot and nail care. Past medical history, meds, and allergies reviewed. Patient states blood sugar is under good  control. Relates he tried the lamisil but it increased his liver enzymes so he stopped taking the medicine.  Objective:   Objective:  Patients chart is reviewed.  Vascular status reveals pedal pulses noted at  2 out of 4 dp and pt bilateral .  Neurological sensation is Normal to [facility] monofilament bilateral at 5/5 sites bilateral.  Dermatological exam reveals  absence of pre ulcerative/ hyperkeratotic lesions.   Toenails are elongated, incurvated, discolored, dystrophic with ingrown deformity present. He is using jublia on bilateral great toenails with minimal improvement   Assessment: Diabetes with symptomatic mycotic toenails.  Plan: Discussed treatment options and alternatives. Debrided nails without complication. Discussed just leaving the hallux nails alone as they are so infected with fungus it is doubtful they will clear without oral medication which is is unable to take due to liver enzymes.  Return appointment recommended at routine intervals of 3 months.   Dennis Sims, DPM

## 2015-04-19 IMAGING — CT CT ABD-PELV W/O CM
2 of 4 series · 13 of 32 positions shown, 19 images · non-contrast
Comparison: 11/10/2011.

CLINICAL DATA: Left lower quadrant and left groin pain.
Microhematuria. Nausea.

EXAM:
CT ABDOMEN AND PELVIS WITHOUT CONTRAST
TECHNIQUE: Multidetector CT imaging of the abdomen and pelvis was performed
following the standard protocol without intravenous contrast.

[Series 400: sagittal · sagittal · 0.94mm/px · 11 of 190 slices shown, 17 images]
[im 16/190  soft-tissue]
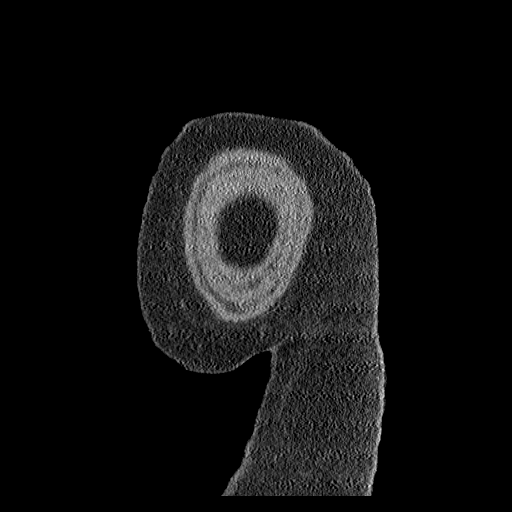
[im 16/190  lung]
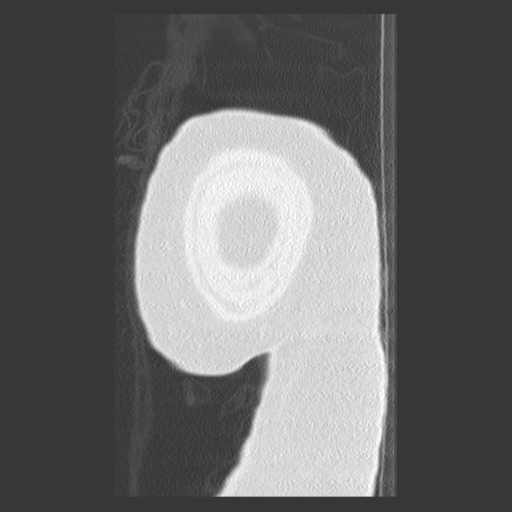
[im 16/190  bone]
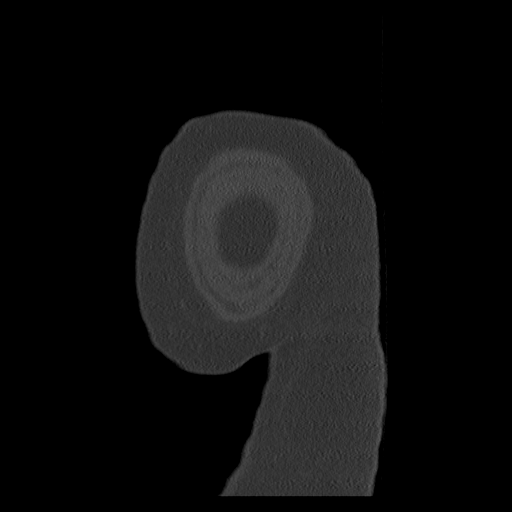
[im 32/190  soft-tissue]
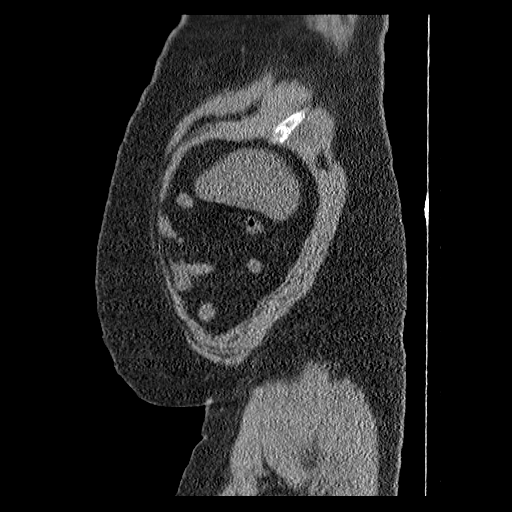
[im 32/190  lung]
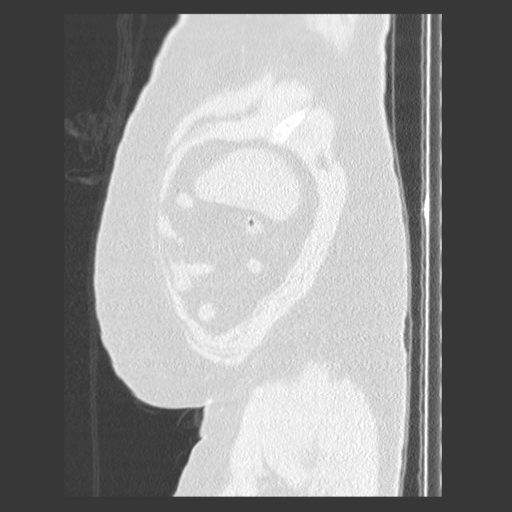
[im 48/190  soft-tissue]
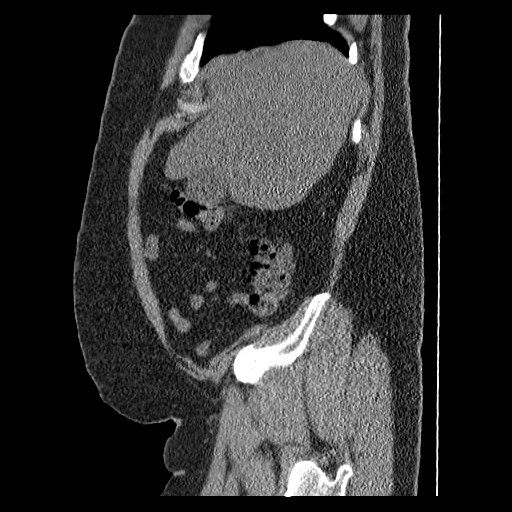
[im 48/190  lung]
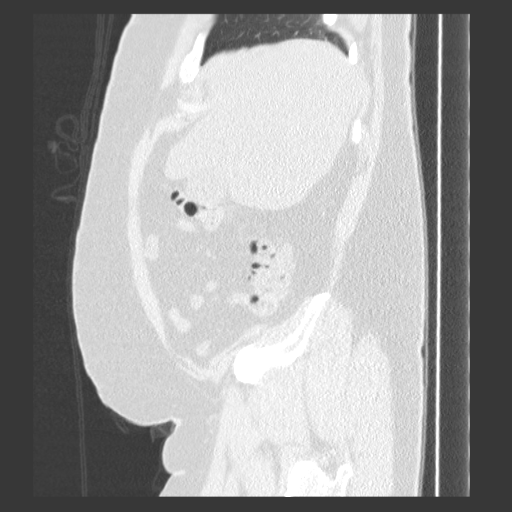
[im 64/190  soft-tissue]
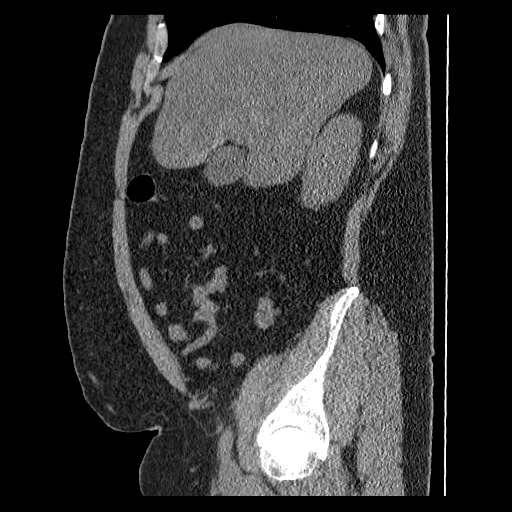
[im 64/190  lung]
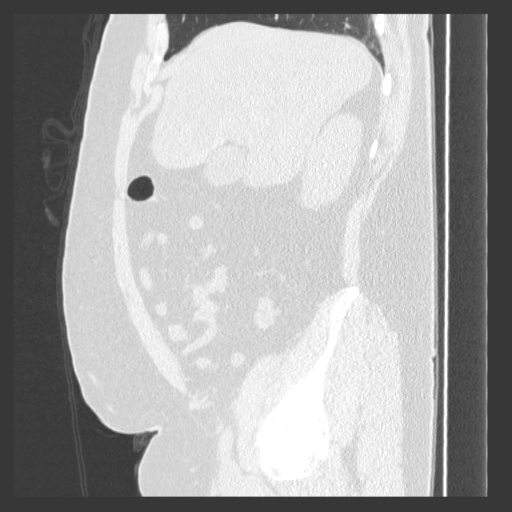
[im 79/190  soft-tissue]
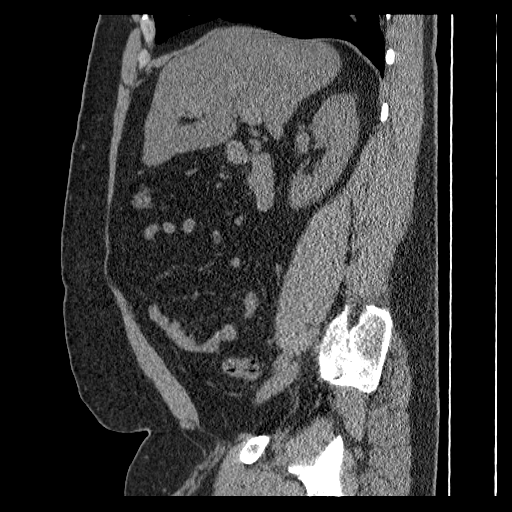
[im 95/190  soft-tissue]
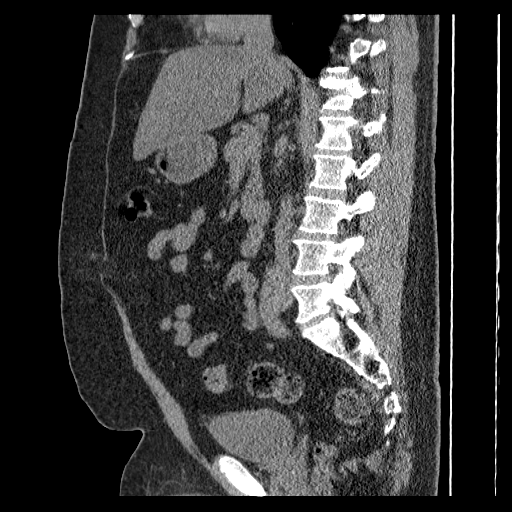
[im 111/190  soft-tissue]
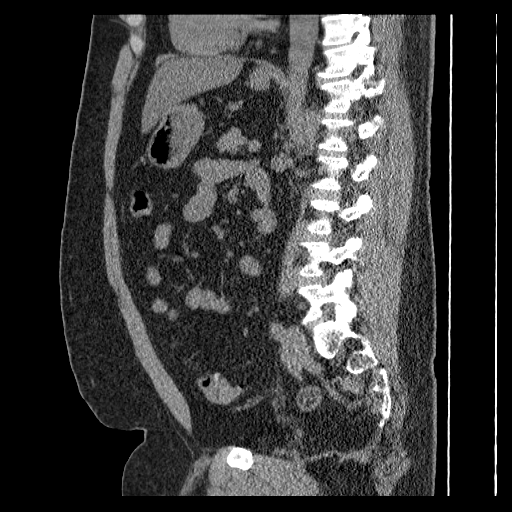
[im 127/190  soft-tissue]
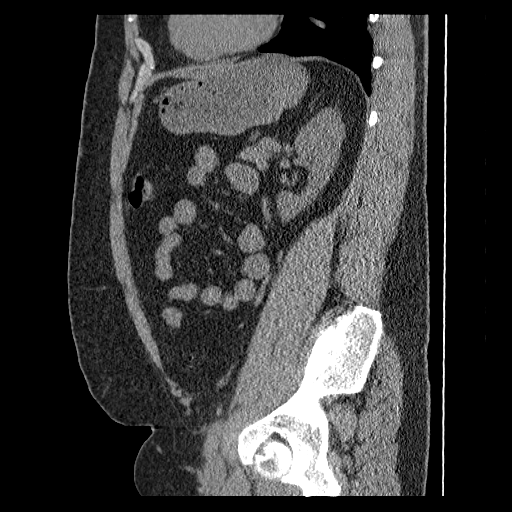
[im 142/190  soft-tissue]
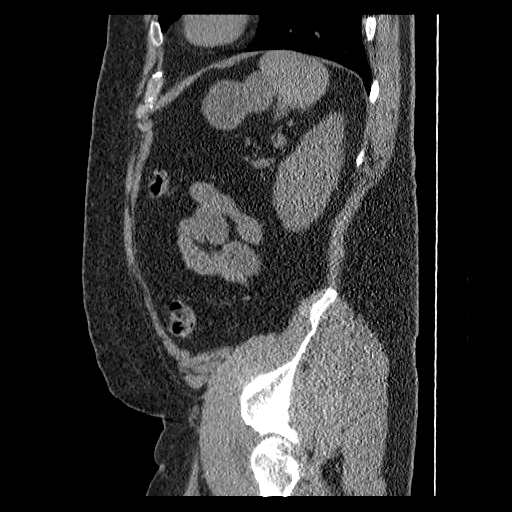
[im 142/190  bone]
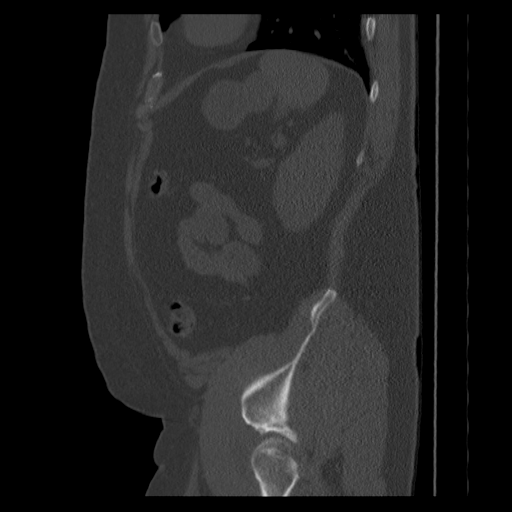
[im 158/190  soft-tissue]
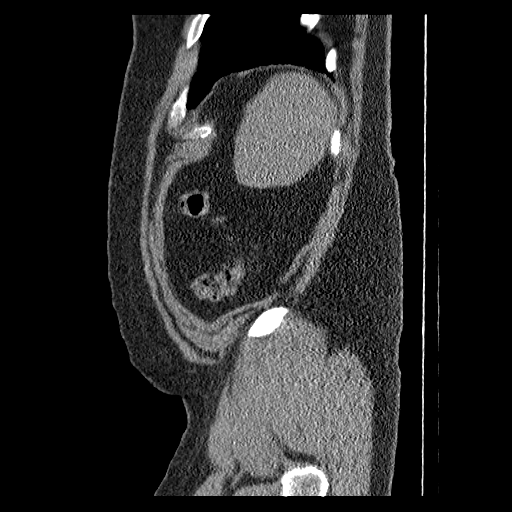
[im 174/190  soft-tissue]
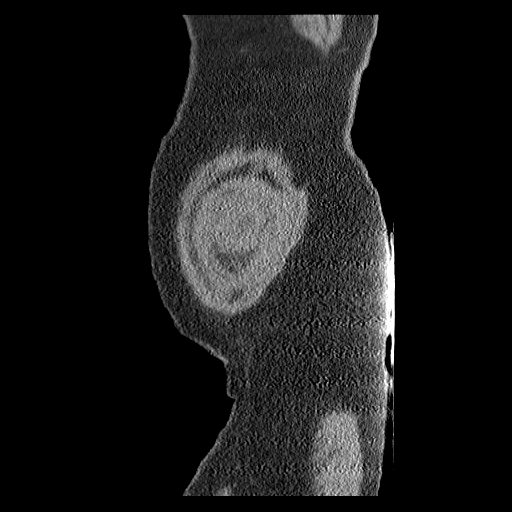

[Series 401: coronal · coronal · 0.94mm/px · 2 of 147 slices shown]
[im 17/147  soft-tissue]
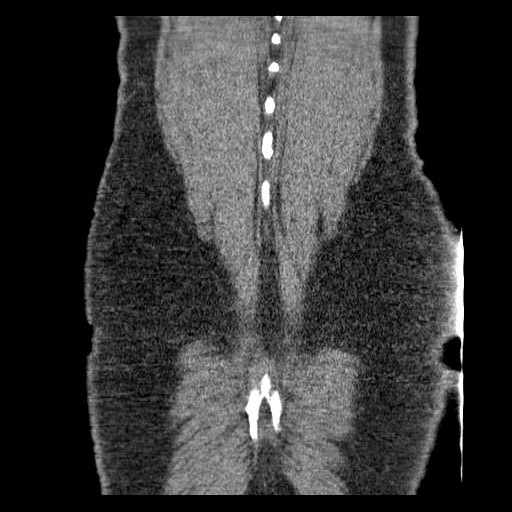
[im 33/147  soft-tissue]
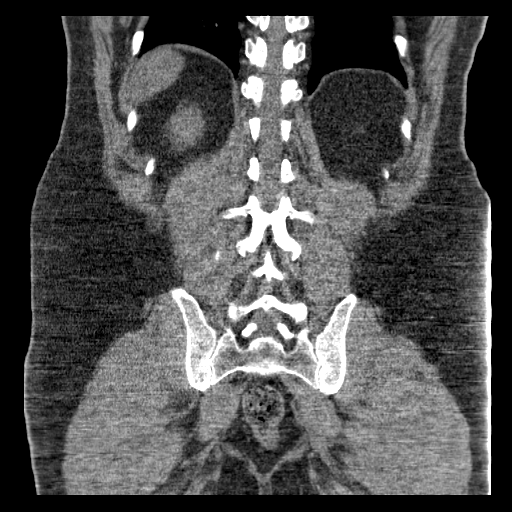

[13 of 32 positions shown; findings below may reference images not displayed]

FINDINGS: Lung bases show no acute findings. Heart size normal. No pericardial
or pleural effusion.

Liver is decreased in attenuation diffusely. Liver, gallbladder and
right adrenal gland are otherwise unremarkable. A 2.3 x 2.6 cm
low-attenuation lesion is seen in the left adrenal gland, unchanged.
Small exophytic lesions off the kidneys measure up to 2.0 cm on the
left and are grossly unchanged. Probable small punctate stones in
the right kidney. Ureters are decompressed. Spleen, pancreas,
stomach and bowel are unremarkable.

Midline ventral hernia is seen above the umbilicus, containing
omental fat. Atherosclerotic calcification of the arterial
vasculature without abdominal aortic aneurysm. Gastrohepatic
ligament lymph nodes are subcentimeter in short axis size. No
pathologically enlarged lymph nodes. No free fluid. No worrisome
lytic or sclerotic lesions.
IMPRESSION: 1. Probable tiny right renal stones. No acute findings to explain
the patient's given symptoms.
2. Hepatic steatosis.
3. Left adrenal adenoma.
4. Midline ventral hernia, containing omental fat.

## 2015-05-09 ENCOUNTER — Ambulatory Visit: Payer: 59 | Admitting: Podiatrist

## 2015-05-28 ENCOUNTER — Ambulatory Visit (INDEPENDENT_AMBULATORY_CARE_PROVIDER_SITE_OTHER): Payer: 59 | Admitting: Podiatry

## 2015-05-28 ENCOUNTER — Encounter: Payer: Self-pay | Admitting: Podiatry

## 2015-05-28 VITALS — BP 156/100 | HR 76 | Resp 18

## 2015-05-28 DIAGNOSIS — B351 Tinea unguium: Secondary | ICD-10-CM

## 2015-05-28 DIAGNOSIS — M79673 Pain in unspecified foot: Secondary | ICD-10-CM

## 2015-05-28 NOTE — Progress Notes (Signed)
Patient ID: Dennis Sims, male   DOB: Jul 09, 1966, 49 y.o.   MRN: [id number] Complaint:  Visit Type: Patient returns to my office for continued preventative foot care services. Complaint: Patient states" my nails have grown long and thick and become painful to walk and wear shoes". He presents for preventative foot care services. No changes to ROS  Podiatric Exam: Vascular: dorsalis pedis and posterior tibial pulses are palpable bilateral. Capillary return is immediate. Temperature gradient is WNL. Skin turgor WNL  Sensorium: Normal Semmes Weinstein monofilament test. Normal tactile sensation bilaterally. Nail Exam: Pt has thick disfigured discolored nails with subungual debris noted bilateral entire nail hallux through  Ulcer Exam: There is no evidence of ulcer or pre-ulcerative changes or infection. Orthopedic Exam: Muscle tone and strength are WNL. No limitations in general ROM. No crepitus or effusions noted. Foot type and digits show no abnormalities. Bony prominences are unremarkable. Skin: No Porokeratosis. No infection or ulcers  Diagnosis:  Tinea unguium, Pain in right toe, pain in left toes  Treatment & Plan Procedures and Treatment: Consent by patient was obtained for treatment procedures. The patient understood the discussion of treatment and procedures well. All questions were answered thoroughly reviewed. Debridement of mycotic and hypertrophic toenails, 1 through 5 bilateral and clearing of subungual debris. No ulceration, no infection noted.  Return Visit-Office Procedure: Patient instructed to return to the office for a follow up visit 3 months for continued evaluation and treatment.  He discussed his plantar fascitis and says he wants surgical consult for EPF.  He was given Dr. Al Corpus name for this surgical consultation.

## 2015-06-11 ENCOUNTER — Ambulatory Visit: Payer: 59 | Admitting: Podiatry

## 2015-06-20 ENCOUNTER — Encounter: Payer: Self-pay | Admitting: Podiatry

## 2015-06-20 ENCOUNTER — Ambulatory Visit (INDEPENDENT_AMBULATORY_CARE_PROVIDER_SITE_OTHER): Payer: 59 | Admitting: Podiatry

## 2015-06-20 VITALS — BP 179/100 | HR 78 | Resp 16

## 2015-06-20 DIAGNOSIS — L600 Ingrowing nail: Secondary | ICD-10-CM | POA: Diagnosis not present

## 2015-06-20 DIAGNOSIS — L603 Nail dystrophy: Secondary | ICD-10-CM | POA: Diagnosis not present

## 2015-06-20 DIAGNOSIS — M722 Plantar fascial fibromatosis: Secondary | ICD-10-CM

## 2015-06-20 NOTE — Progress Notes (Signed)
He presents today for surgical consult regarding his hallux nail plates bilaterally. He states they've been bothering him for years and he has come to us for many years to have the nails trimmed and the corners cut out. He states that he also has chronic plantar fasciitis and would like to consider another surgical intervention for that condition as well. He denies any past medical history changes medications allergies surgeries or social history.  Objective: Vital signs are stable alert and oriented 3. Pulses are palpable bilateral. Severe incurvated nail margins to the hallux bilateral. Currently do not appear to be infected. Has mild tenderness on palpation of the plantar fascial bilaterally.  Assessment: Dystrophic hallux nails bilaterally. Non-complicated at this point. Low-grade chronic plantar fasciitis bilateral.  Plan: Discussed etiology pathology conservative versus surgical therapies. At this point he would like to have his nail plate surgically removed. We went over consent form today line by line number by number given him ample time to ask questions he saw fit regarding surgical excision hallux nail plates bilateral. I answered all of her questions regarding these procedures to the best of my ability in layman's terms. He understands and is amenable to it and signed all 3 pages of the consent form. We did discuss the possible postop complications which may include but are not limited to postop pain bleeding as well as infection recurrence chronic pain need for further surgery loss of digit loss of limb loss of life.

## 2015-06-26 ENCOUNTER — Telehealth: Payer: Self-pay | Admitting: *Deleted

## 2015-06-26 NOTE — Telephone Encounter (Signed)
Patient's [facility]/2015 [facility] needed authorization.  Procedure code is 11750.  Authorization number is [id number].

## 2015-06-27 ENCOUNTER — Encounter: Payer: Self-pay | Admitting: *Deleted

## 2015-06-27 NOTE — Progress Notes (Unsigned)
Patient is scheduled for bilateral hallux ingrown toenail procedure on 07/19/2015.  Authorization was needed.  There are 2 authorization numbers [id number] and [id number].

## 2015-07-16 ENCOUNTER — Telehealth: Payer: Self-pay | Admitting: *Deleted

## 2015-07-16 NOTE — Telephone Encounter (Signed)
"  I have an operation scheduled with Dr. Al Corpus this Friday morning.  I need to make sure of what my insurance is covering and  not covering.  You can talk to my wife, I'm out and about.  I give you permission to do that.  Her name is Lea.  We just want to make sure we have the funds.  Again, you can talk to my wife."

## 2015-07-17 ENCOUNTER — Other Ambulatory Visit: Payer: Self-pay | Admitting: Podiatry

## 2015-07-17 MED ORDER — MEPERIDINE HCL 50 MG PO TABS: ORAL_TABLET | ORAL | Status: DC

## 2015-07-17 MED ORDER — PROMETHAZINE HCL 25 MG PO TABS: 25.0000 mg | ORAL_TABLET | Freq: Three times a day (TID) | ORAL | Status: DC | PRN

## 2015-07-17 MED ORDER — CEPHALEXIN 500 MG PO CAPS: 500.0000 mg | ORAL_CAPSULE | Freq: Three times a day (TID) | ORAL | Status: DC

## 2015-07-17 NOTE — Telephone Encounter (Signed)
"  I'm returning your call."  I gave him estimate of $725.  You can make payment arrangements if you need to.  "No, I'll just go ahead and pay for it all and get it out the way.  How do I go about getting an estimate from the surgical center?"  You can call the surgical center and they can give you the number to call.  "Is everything okay referral wise?  I want to make sure I'm not stuck with a huge expense."  I got your surgery pre-certed so referral has to be okay.  "Okay, thank you very much."

## 2015-07-17 NOTE — Telephone Encounter (Signed)
I attempted to call patient with an estimate I left him a message to call me back.  I attempted to call patient's spouse as requested, no answer.  He has a $800 deductible once it is met insurance will cover at 80% and he'll be responsible for 20%.  Deductible has not been met.  Estimated cost is about $725.  Payment arrangements can be made.

## 2015-07-19 ENCOUNTER — Encounter: Payer: Self-pay | Admitting: Podiatry

## 2015-07-19 DIAGNOSIS — L600 Ingrowing nail: Secondary | ICD-10-CM | POA: Diagnosis not present

## 2015-07-22 ENCOUNTER — Telehealth: Payer: Self-pay | Admitting: *Deleted

## 2015-07-22 NOTE — Telephone Encounter (Signed)
Per Dr. Al Corpus - patient can stop antibiotics. No need for another antibiotic.

## 2015-07-22 NOTE — Telephone Encounter (Addendum)
Pt's wife, Clint Lipps states the antibiotic is making pt lethagic, and not able to function, is totally off the Demerol, and has only taken 2 Tramadol from an old rx, without any tiredness.  Lea states antibiotics do this to him except the Z-Pak.  I told Lea that Dr. Al Corpus may want the Keflex to get out of pt's system and he may not call in an antibiotic at all.  Informed pt's wife, Clint Lipps of Dr. Geryl Rankins orders.

## 2015-07-22 NOTE — Progress Notes (Signed)
DOS 07/19/2015 B/L hallux excision nail permanent.

## 2015-07-25 ENCOUNTER — Encounter: Payer: Self-pay | Admitting: Podiatry

## 2015-07-25 ENCOUNTER — Ambulatory Visit (INDEPENDENT_AMBULATORY_CARE_PROVIDER_SITE_OTHER): Payer: 59 | Admitting: Podiatry

## 2015-07-25 VITALS — BP 160/103 | HR 73 | Resp 16

## 2015-07-25 DIAGNOSIS — Z9889 Other specified postprocedural states: Secondary | ICD-10-CM

## 2015-07-25 DIAGNOSIS — L600 Ingrowing nail: Secondary | ICD-10-CM

## 2015-07-25 NOTE — Progress Notes (Signed)
He presents today for follow-up of surgical excision nails hallux bilateral. He denies fever chills nausea vomiting muscle aches pains. Continues to take his antibiotic's and his pain medication.  Objective: Vital signs are stable he is alert and oriented 3. Dry sterile dressings are intact. Was removed demonstrates no erythema edema cellulitis drainage or odor. Sutures are intact. Appear to be healing well.  Assessment: Well-healing surgical sites hallux bilateral.  Plan: Discussed etiology pathology conservative versus surgical therapies. Redressed his toes today and suggested that he leave the dressing intact until next week should've questions or concerns he will notify last immediately.

## 2015-08-06 ENCOUNTER — Encounter: Payer: Self-pay | Admitting: Podiatry

## 2015-08-06 ENCOUNTER — Ambulatory Visit (INDEPENDENT_AMBULATORY_CARE_PROVIDER_SITE_OTHER): Payer: 59 | Admitting: Podiatry

## 2015-08-06 VITALS — BP 168/112 | HR 75 | Temp 98.5°F | Resp 16

## 2015-08-06 DIAGNOSIS — Z9889 Other specified postprocedural states: Secondary | ICD-10-CM

## 2015-08-06 DIAGNOSIS — L600 Ingrowing nail: Secondary | ICD-10-CM

## 2015-08-06 NOTE — Progress Notes (Signed)
Mr. Bonnin presents today for follow-up of his surgical nail excision hallux bilaterally. He states he is doing very well and has a little stinging and burning every once in a while.  Objective: Vital signs are stable alert and oriented 3 dry sterile dressing intact once removed reveals sutures are intact margins well coapted granulation tissue has epithelialized very nicely. I see no signs of infection. Sutures are ready to be removed.  Assessment: Well-healing surgical toes 3 weeks.  Plan: Removed as many sutures as we could today there is considerable amount of eschar present we will be able to remove the remainder in the next week or 2. Overall I am start getting this wet and apply a Band-Aid on a daily basis. He will soak in Epsom salts and warm water twice daily.

## 2015-08-13 ENCOUNTER — Encounter: Payer: Self-pay | Admitting: Podiatry

## 2015-08-13 ENCOUNTER — Ambulatory Visit (INDEPENDENT_AMBULATORY_CARE_PROVIDER_SITE_OTHER): Payer: 59 | Admitting: Podiatry

## 2015-08-13 DIAGNOSIS — Z9889 Other specified postprocedural states: Secondary | ICD-10-CM

## 2015-08-13 DIAGNOSIS — L600 Ingrowing nail: Secondary | ICD-10-CM

## 2015-08-13 NOTE — Progress Notes (Signed)
He presents today for follow-up of his surgical excision matrixectomy hallux bilateral. Date of surgery was 07/19/2015. He states they seem to be doing pretty well. There are a few sutures remaining.   Objective: Vital signs are stable he is alert and oriented 3 area pulses are strongly palpable. Hallux demonstrates no erythema edema saline as drainage or odor bilaterally. I see no no nail regrowth. There are a few sutures remaining which are too painful for him to remove at this point. sowe will remove them next visit at which time we will place numbing cream over the toe.   Assessment: well-healing surgical toes.  Plan: I will follow-up with him in 2 weeks.

## 2015-08-27 ENCOUNTER — Ambulatory Visit (INDEPENDENT_AMBULATORY_CARE_PROVIDER_SITE_OTHER): Payer: 59 | Admitting: Podiatry

## 2015-08-27 ENCOUNTER — Encounter: Payer: Self-pay | Admitting: Podiatry

## 2015-08-27 DIAGNOSIS — Z9889 Other specified postprocedural states: Secondary | ICD-10-CM

## 2015-08-27 DIAGNOSIS — L600 Ingrowing nail: Secondary | ICD-10-CM

## 2015-08-27 NOTE — Progress Notes (Signed)
He presents today 6 weeks status post nail avulsion and matrixectomy hallux bilateral. He states they're doing much better.  Objective: Vital signs are stable alert and oriented 3. Pulses are strongly palpable. Remainder of the sutures were removed today.  Assessment: Well-healed surgical toes.  Plan follow up with me in 2 weeks.

## 2015-09-10 ENCOUNTER — Encounter: Payer: 59 | Admitting: Podiatry

## 2015-09-19 ENCOUNTER — Ambulatory Visit (INDEPENDENT_AMBULATORY_CARE_PROVIDER_SITE_OTHER): Payer: 59 | Admitting: Podiatry

## 2015-09-19 DIAGNOSIS — L600 Ingrowing nail: Secondary | ICD-10-CM

## 2015-09-19 DIAGNOSIS — Z9889 Other specified postprocedural states: Secondary | ICD-10-CM

## 2015-09-19 NOTE — Progress Notes (Signed)
He presents today for follow-up of his bilateral surgical matrixectomy's. He denies fever chills nausea vomiting muscle aches and pains states that he seems to be doing a lot better. He states that the toes don't hurt any longer and he is very excited about that.  Objective: Vital signs are stable he is alert and oriented 3. Ear is no erythema or edema cellulitis drainage odor. No nail regrowth at this point.  Assessment well healing surgical toes hallux bilateral.  Plan: Follow up with me as needed.

## 2016-02-22 ENCOUNTER — Encounter (HOSPITAL_COMMUNITY): Payer: Self-pay | Admitting: Emergency Medicine

## 2016-02-22 ENCOUNTER — Emergency Department (HOSPITAL_COMMUNITY)
Admission: EM | Admit: 2016-02-22 | Discharge: 2016-02-22 | Disposition: A | Discharge status: Home / Self Care | Payer: BLUE CROSS/BLUE SHIELD | Source: Home / Self Care | Attending: Emergency Medicine | Admitting: Emergency Medicine

## 2016-02-22 DIAGNOSIS — B349 Viral infection, unspecified: Secondary | ICD-10-CM | POA: Diagnosis not present

## 2016-02-22 DIAGNOSIS — R6889 Other general symptoms and signs: Secondary | ICD-10-CM | POA: Diagnosis not present

## 2016-02-22 DIAGNOSIS — J9801 Acute bronchospasm: Secondary | ICD-10-CM

## 2016-02-22 MED ORDER — ALBUTEROL SULFATE (2.5 MG/3ML) 0.083% IN NEBU
INHALATION_SOLUTION | RESPIRATORY_TRACT | Status: AC
  Filled 2016-02-22: qty 3

## 2016-02-22 MED ORDER — IPRATROPIUM-ALBUTEROL 0.5-2.5 (3) MG/3ML IN SOLN
3.0000 mL | Freq: Once | RESPIRATORY_TRACT | Status: AC
  Administered 2016-02-22: 3 mL via RESPIRATORY_TRACT

## 2016-02-22 MED ORDER — PREDNISONE 20 MG PO TABS: ORAL_TABLET | ORAL | Status: DC

## 2016-02-22 MED ORDER — IPRATROPIUM-ALBUTEROL 0.5-2.5 (3) MG/3ML IN SOLN
RESPIRATORY_TRACT | Status: AC
  Filled 2016-02-22: qty 3

## 2016-02-22 MED ORDER — METHYLPREDNISOLONE SODIUM SUCC 125 MG IJ SOLR
125.0000 mg | Freq: Once | INTRAMUSCULAR | Status: AC
  Administered 2016-02-22: 125 mg via INTRAMUSCULAR

## 2016-02-22 MED ORDER — TRAMADOL HCL 50 MG PO TABS: 50.0000 mg | ORAL_TABLET | Freq: Four times a day (QID) | ORAL | Status: DC | PRN

## 2016-02-22 MED ORDER — ALBUTEROL SULFATE (2.5 MG/3ML) 0.083% IN NEBU
2.5000 mg | INHALATION_SOLUTION | Freq: Once | RESPIRATORY_TRACT | Status: AC
  Administered 2016-02-22: 2.5 mg via RESPIRATORY_TRACT

## 2016-02-22 MED ORDER — METHYLPREDNISOLONE SODIUM SUCC 125 MG IJ SOLR
INTRAMUSCULAR | Status: AC
  Filled 2016-02-22: qty 2

## 2016-02-22 MED ORDER — ALBUTEROL SULFATE HFA 108 (90 BASE) MCG/ACT IN AERS: 2.0000 | INHALATION_SPRAY | RESPIRATORY_TRACT | Status: DC | PRN

## 2016-02-22 NOTE — Discharge Instructions (Signed)
Viral Infections For nasal and head congestion may take Sudafed PE 10 mg every 4 hours as needed. Saline nasal spray used frequently. For drainage may use Allegra, Claritin or Zyrtec. If you need stronger medicine to stop drainage may take Chlor-Trimeton 2-4 mg every 4 hours. This may cause drowsiness. Ibuprofen 600 mg every 6 hours as needed for pain, discomfort or fever. Drink plenty of fluids and stay well-hydrated. Robitusin DM for cough and congestion Albuterol and Prednisone for cough, wheeze and chest inflammation A viral infection can be caused by different types of viruses.Most viral infections are not serious and resolve on their own. However, some infections may cause severe symptoms and may lead to further complications. SYMPTOMS Viruses can frequently cause: 1. Minor sore throat. 2. Aches and pains. 3. Headaches. 4. Runny nose. 5. Different types of rashes. 6. Watery eyes. 7. Tiredness. 8. Cough. 9. Loss of appetite. 10. Gastrointestinal infections, resulting in nausea, vomiting, and diarrhea. These symptoms do not respond to antibiotics because the infection is not caused by bacteria. However, you might catch a bacterial infection following the viral infection. This is sometimes called a "superinfection." Symptoms of such a bacterial infection may include:  Worsening sore throat with pus and difficulty swallowing.  Swollen neck glands.  Chills and a high or persistent fever.  Severe headache.  Tenderness over the sinuses.  Persistent overall ill feeling (malaise), muscle aches, and tiredness (fatigue).  Persistent cough.  Yellow, green, or brown mucus production with coughing. HOME CARE INSTRUCTIONS   Only take over-the-counter or prescription medicines for pain, discomfort, diarrhea, or fever as directed by your caregiver.  Drink enough water and fluids to keep your urine clear or pale yellow. Sports drinks can provide valuable electrolytes, sugars, and  hydration.  Get plenty of rest and maintain proper nutrition. Soups and broths with crackers or rice are fine. SEEK IMMEDIATE MEDICAL CARE IF:   You have severe headaches, shortness of breath, chest pain, neck pain, or an unusual rash.  You have uncontrolled vomiting, diarrhea, or you are unable to keep down fluids.  You or your child has an oral temperature above 102 F (38.9 C), not controlled by medicine.  Your baby is older than 3 months with a rectal temperature of 102 F (38.9 C) or higher.  Your baby is 15 months old or younger with a rectal temperature of 100.4 F (38 C) or higher. MAKE SURE YOU:   Understand these instructions.  Will watch your condition.  Will get help right away if you are not doing well or get worse.   This information is not intended to replace advice given to you by your health care provider. Make sure you discuss any questions you have with your health care provider.   Document Released: 09/09/2005 Document Revised: 02/22/2012 Document Reviewed: 05/08/2015 Elsevier Interactive Patient Education 2016 [employer] Inc.  Bronchospasm, Adult A bronchospasm is a spasm or tightening of the airways going into the lungs. During a bronchospasm breathing becomes more difficult because the airways get smaller. When this happens there can be coughing, a whistling sound when breathing (wheezing), and difficulty breathing. Bronchospasm is often associated with asthma, but not all patients who experience a bronchospasm have asthma. CAUSES  A bronchospasm is caused by inflammation or irritation of the airways. The inflammation or irritation may be triggered by:  11. Allergies (such as to animals, pollen, food, or mold). Allergens that cause bronchospasm may cause wheezing immediately after exposure or many hours later.  12. Infection. Viral  infections are believed to be the most common cause of bronchospasm.  13. Exercise.  14. Irritants (such as pollution,  cigarette smoke, strong odors, aerosol sprays, and paint fumes).  15. Weather changes. Winds increase molds and pollens in the air. Rain refreshes the air by washing irritants out. Cold air may cause inflammation.  16. Stress and emotional upset.  SIGNS AND SYMPTOMS   Wheezing.   Excessive nighttime coughing.   Frequent or severe coughing with a simple cold.   Chest tightness.   Shortness of breath.  DIAGNOSIS  Bronchospasm is usually diagnosed through a history and physical exam. Tests, such as chest X-rays, are sometimes done to look for other conditions. TREATMENT   Inhaled medicines can be given to open up your airways and help you breathe. The medicines can be given using either an inhaler or a nebulizer machine.  Corticosteroid medicines may be given for severe bronchospasm, usually when it is associated with asthma. HOME CARE INSTRUCTIONS   Always have a plan prepared for seeking medical care. Know when to call your health care provider and local emergency services (911 in the [place] Know where you can access local emergency care.  Only take medicines as directed by your health care provider.  If you were prescribed an inhaler or nebulizer machine, ask your health care provider to explain how to use it correctly. Always use a spacer with your inhaler if you were given one.  It is necessary to remain calm during an attack. Try to relax and breathe more slowly.  Control your home environment in the following ways:   Change your heating and air conditioning filter at least once a month.   Limit your use of fireplaces and wood stoves.  Do not smoke and do not allow smoking in your home.   Avoid exposure to perfumes and fragrances.   Get rid of pests (such as roaches and mice) and their droppings.   Throw away plants if you see mold on them.   Keep your house clean and dust free.   Replace carpet with wood, tile, or vinyl flooring. Carpet can trap  dander and dust.   Use allergy-proof pillows, mattress covers, and box spring covers.   Wash bed sheets and blankets every week in hot water and dry them in a dryer.   Use blankets that are made of polyester or cotton.   Wash hands frequently. SEEK MEDICAL CARE IF:   You have muscle aches.   You have chest pain.   The sputum changes from clear or white to yellow, green, gray, or bloody.   The sputum you cough up gets thicker.   There are problems that may be related to the medicine you are given, such as a rash, itching, swelling, or trouble breathing.  SEEK IMMEDIATE MEDICAL CARE IF:   You have worsening wheezing and coughing even after taking your prescribed medicines.   You have increased difficulty breathing.   You develop severe chest pain. MAKE SURE YOU:   Understand these instructions.  Will watch your condition.  Will get help right away if you are not doing well or get worse.   This information is not intended to replace advice given to you by your health care provider. Make sure you discuss any questions you have with your health care provider.   Document Released: 12/03/2003 Document Revised: 12/21/2014 Document Reviewed: 05/22/2013 Elsevier Interactive Patient Education 2016 [employer].  How to Use an Inhaler Using your inhaler correctly is very  important. Good technique will make sure that the medicine reaches your lungs.  HOW TO USE AN INHALER: 17. Take the cap off the inhaler. 18. If this is the first time using your inhaler, you need to prime it. Shake the inhaler for 5 seconds. Release four puffs into the air, away from your face. Ask your doctor for help if you have questions. 19. Shake the inhaler for 5 seconds. 20. Turn the inhaler so the bottle is above the mouthpiece. 21. Put your pointer finger on top of the bottle. Your thumb holds the bottom of the inhaler. 22. Open your mouth. 23. Either hold the inhaler away from your mouth  (the width of 2 fingers) or place your lips tightly around the mouthpiece. Ask your doctor which way to use your inhaler. 24. Breathe out as much air as possible. 25. Breathe in and push down on the bottle 1 time to release the medicine. You will feel the medicine go in your mouth and throat. 26. Continue to take a deep breath in very slowly. Try to fill your lungs. 27. After you have breathed in completely, hold your breath for 10 seconds. This will help the medicine to settle in your lungs. If you cannot hold your breath for 10 seconds, hold it for as long as you can before you breathe out. 28. Breathe out slowly, through pursed lips. Whistling is an example of pursed lips. 29. If your doctor has told you to take more than 1 puff, wait at least 15-30 seconds between puffs. This will help you get the best results from your medicine. Do not use the inhaler more than your doctor tells you to. 30. Put the cap back on the inhaler. 31. Follow the directions from your doctor or from the inhaler package about cleaning the inhaler. If you use more than one inhaler, ask your doctor which inhalers to use and what order to use them in. Ask your doctor to help you figure out when you will need to refill your inhaler.  If you use a steroid inhaler, always rinse your mouth with water after your last puff, gargle and spit out the water. Do not swallow the water. GET HELP IF:  The inhaler medicine only partially helps to stop wheezing or shortness of breath.  You are having trouble using your inhaler.  You have some increase in thick spit (phlegm). GET HELP RIGHT AWAY IF:  The inhaler medicine does not help your wheezing or shortness of breath or you have tightness in your chest.  You have dizziness, headaches, or fast heart rate.  You have chills, fever, or night sweats.  You have a large increase of thick spit, or your thick spit is bloody. MAKE SURE YOU:   Understand these instructions.  Will  watch your condition.  Will get help right away if you are not doing well or get worse.   This information is not intended to replace advice given to you by your health care provider. Make sure you discuss any questions you have with your health care provider.   Document Released: 09/08/2008 Document Revised: 09/20/2013 Document Reviewed: 06/29/2013 Elsevier Interactive Patient Education [employer]2016 Elsevier Inc.

## 2016-02-22 NOTE — ED Notes (Signed)
C/o cold sx onset 2 days associated w/runny nose, dry cough, BA, chills, SOB... Has tried OTC Mucinex and Ibup w/no relief... Denies fevers.... A&O x4... No acute distress.

## 2016-02-22 NOTE — ED Provider Notes (Signed)
CSN: [id number]     Arrival date & time 02/22/16  1302 History   First MD Initiated Contact with Patient 02/22/16 1329     Chief Complaint  Patient presents with  . URI   (Consider location/radiation/quality/duration/timing/severity/associated sxs/prior Treatment) HPI Comments: 50 year old morbidly obese male with a history of cough and body aches, fatigue and malaise, shortness of breath but no wheezing. He complains of abdominal wall pain particularly when coughing and sitting up. He smokes one pack per day. Denies having fevers at home. Afebrile today. He did not receive a flu shot this year.   Past Medical History  Diagnosis Date  . Sleep apnea   . Diverticulitis    Past Surgical History  Procedure Laterality Date  . Colonscopy    . Dental surgery    . Foot surgery     No family history on file. Social History  Substance Use Topics  . Smoking status: Current Every Day Smoker -- 1.00 packs/day for 15 years    Types: Cigarettes  . Smokeless tobacco: None  . Alcohol Use: Yes     Comment: BEER AND ALCOHOL    Review of Systems  Constitutional: Positive for fever, activity change and fatigue. Negative for diaphoresis.  HENT: Negative for congestion, ear pain, facial swelling, postnasal drip, rhinorrhea, sore throat and trouble swallowing.   Eyes: Negative for pain, discharge and redness.  Respiratory: Positive for cough and shortness of breath. Negative for chest tightness and wheezing.   Cardiovascular: Negative.   Gastrointestinal: Negative.   Genitourinary: Negative.   Musculoskeletal: Negative.  Negative for neck pain and neck stiffness.  Skin: Negative.   Neurological: Negative.     Allergies  Keflex and Oxycodone  Home Medications   Prior to Admission medications   Medication Sig Start Date End Date Taking? Authorizing Provider  clotrimazole-betamethasone (LOTRISONE) cream  11/15/13  Yes Historical Provider, MD  acetaminophen (TYLENOL) 500 MG tablet Take 1,000  mg by mouth every 6 (six) hours as needed.    Historical Provider, MD  albuterol (PROVENTIL HFA;VENTOLIN HFA) 108 (90 Base) MCG/ACT inhaler Inhale 2 puffs into the lungs every 4 (four) hours as needed for wheezing or shortness of breath. 02/22/16   Hayden Rasmussen, NP  buPROPion (WELLBUTRIN XL) 300 MG 24 hr tablet  11/15/13   Historical Provider, MD  polyethylene glycol-electrolytes (NULYTELY/GOLYTELY) 420 G solution Take 4,000 mLs by mouth once.    Historical Provider, MD  predniSONE (DELTASONE) 20 MG tablet 3 Tabs PO Days 1-3, then 2 tabs PO Days 4-6, then 1 tab PO Day 7-9, then Half Tab PO Day 10-12 02/22/16   Hayden Rasmussen, NP  [facility] external suspension  11/03/13   Historical Provider, MD  traMADol (ULTRAM) 50 MG tablet Take 1 tablet (50 mg total) by mouth every 6 (six) hours as needed. For bodyaches 02/22/16   Hayden Rasmussen, NP  zolpidem (AMBIEN) 10 MG tablet Take 10 mg by mouth at bedtime as needed for sleep.    Historical Provider, MD   Meds Ordered and Administered this Visit   Medications  ipratropium-albuterol (DUONEB) 0.5-2.5 (3) MG/3ML nebulizer solution 3 mL (3 mLs Nebulization Given 02/22/16 1422)  albuterol (PROVENTIL) (2.5 MG/3ML) 0.083% nebulizer solution 2.5 mg (2.5 mg Nebulization Given 02/22/16 1422)  methylPREDNISolone sodium succinate (SOLU-MEDROL) 125 mg/2 mL injection 125 mg (125 mg Intramuscular Given 02/22/16 1422)    BP 152/91 mmHg  Pulse 79  Temp(Src) 98.4 F (36.9 C) (Oral)  Resp 18  SpO2 94% No data found.  Physical Exam  Constitutional: He is oriented to person, place, and time. He appears well-developed and well-nourished. No distress.  HENT:  Mouth/Throat: No oropharyngeal exudate.  Bilateral TMs are normal. Oropharynx with minor erythema, light cobblestoning and clear PND.  Eyes: EOM are normal.  Neck: Normal range of motion. Neck supple.  Cardiovascular: Normal rate, regular rhythm and normal heart sounds.   Pulmonary/Chest: Effort normal. No respiratory  distress. He has wheezes. He has no rales.  Diminished air movement and breath sounds bilaterally diffusely. Forced expiration and cough reveals "tight" wheezing.  Musculoskeletal: Normal range of motion. He exhibits tenderness. He exhibits no edema.  Lymphadenopathy:    He has no cervical adenopathy.  Neurological: He is alert and oriented to person, place, and time.  Skin: Skin is warm and dry. No rash noted.  Psychiatric: He has a normal mood and affect.  Nursing note and vitals reviewed.   ED Course  Procedures (including critical care time)  Labs Review Labs Reviewed - No data to display  Imaging Review No results found.   Visual Acuity Review  Right Eye Distance:   Left Eye Distance:   Bilateral Distance:    Right Eye Near:   Left Eye Near:    Bilateral Near:         MDM   1. Viral syndrome   2. Cough due to bronchospasm   3. Flu-like symptoms    patient received a DuoNeb 5/2.5 mg. Received moderate improvement in air movement and decrease and cough. Auscultation reveals decrease in wheeze, improved air movement. For nasal and head congestion may take Sudafed PE 10 mg every 4 hours as needed. Saline nasal spray used frequently. For drainage may use Allegra, Claritin or Zyrtec. If you need stronger medicine to stop drainage may take Chlor-Trimeton 2-4 mg every 4 hours. This may cause drowsiness. Ibuprofen 600 mg every 6 hours as needed for pain, discomfort or fever. Drink plenty of fluids and stay well-hydrated. Robitusin DM for cough and congestion Albuterol and Prednisone for cough, wheeze and chest inflammation Tramadol 50 mg #15 prn body aches Stop smoking.    Hayden Rasmussenavid Jaclynne Baldo, NP 02/22/16 1453

## 2016-03-16 DIAGNOSIS — H00016 Hordeolum externum left eye, unspecified eyelid: Secondary | ICD-10-CM | POA: Diagnosis not present

## 2016-03-18 DIAGNOSIS — L400 Psoriasis vulgaris: Secondary | ICD-10-CM | POA: Diagnosis not present

## 2016-03-20 DIAGNOSIS — L400 Psoriasis vulgaris: Secondary | ICD-10-CM | POA: Diagnosis not present

## 2016-03-23 DIAGNOSIS — H00015 Hordeolum externum left lower eyelid: Secondary | ICD-10-CM | POA: Diagnosis not present

## 2016-03-23 DIAGNOSIS — H2513 Age-related nuclear cataract, bilateral: Secondary | ICD-10-CM | POA: Diagnosis not present

## 2016-03-23 DIAGNOSIS — L400 Psoriasis vulgaris: Secondary | ICD-10-CM | POA: Diagnosis not present

## 2016-03-23 DIAGNOSIS — H25013 Cortical age-related cataract, bilateral: Secondary | ICD-10-CM | POA: Diagnosis not present

## 2016-03-25 DIAGNOSIS — L400 Psoriasis vulgaris: Secondary | ICD-10-CM | POA: Diagnosis not present

## 2016-03-30 DIAGNOSIS — L400 Psoriasis vulgaris: Secondary | ICD-10-CM | POA: Diagnosis not present

## 2016-04-01 DIAGNOSIS — L400 Psoriasis vulgaris: Secondary | ICD-10-CM | POA: Diagnosis not present

## 2016-04-02 ENCOUNTER — Ambulatory Visit (INDEPENDENT_AMBULATORY_CARE_PROVIDER_SITE_OTHER): Payer: BLUE CROSS/BLUE SHIELD | Admitting: Podiatry

## 2016-04-02 ENCOUNTER — Encounter: Payer: Self-pay | Admitting: Podiatry

## 2016-04-02 DIAGNOSIS — L600 Ingrowing nail: Secondary | ICD-10-CM | POA: Diagnosis not present

## 2016-04-03 DIAGNOSIS — L400 Psoriasis vulgaris: Secondary | ICD-10-CM | POA: Diagnosis not present

## 2016-04-04 NOTE — Progress Notes (Signed)
He presents today with chief complaint of painful toenails to the hallux bilateral. Surgical matrixectomy's were performed in the past and he states that he feels that they're growing back.  Objective: Vital signs are stable alert and oriented 3. Pulses are strongly palpable. Toenails are growing back to some degree under much less dense and less painful than they were previously. These are small spicules and I do not think that they will continue to grow into large nail borders in their entirety.  Assessment: Mild nail spicule nonpainful palpation hallux bilateral. Thick painful nails bilateral  Plan: I debrided these for him today and will follow up with him on an as-needed basis.

## 2016-04-06 DIAGNOSIS — L400 Psoriasis vulgaris: Secondary | ICD-10-CM | POA: Diagnosis not present

## 2016-04-08 DIAGNOSIS — L400 Psoriasis vulgaris: Secondary | ICD-10-CM | POA: Diagnosis not present

## 2016-04-10 DIAGNOSIS — L400 Psoriasis vulgaris: Secondary | ICD-10-CM | POA: Diagnosis not present

## 2016-04-13 DIAGNOSIS — L400 Psoriasis vulgaris: Secondary | ICD-10-CM | POA: Diagnosis not present

## 2016-04-15 DIAGNOSIS — L400 Psoriasis vulgaris: Secondary | ICD-10-CM | POA: Diagnosis not present

## 2016-04-17 DIAGNOSIS — L400 Psoriasis vulgaris: Secondary | ICD-10-CM | POA: Diagnosis not present

## 2016-04-20 DIAGNOSIS — L400 Psoriasis vulgaris: Secondary | ICD-10-CM | POA: Diagnosis not present

## 2016-04-22 DIAGNOSIS — L400 Psoriasis vulgaris: Secondary | ICD-10-CM | POA: Diagnosis not present

## 2016-04-22 DIAGNOSIS — J0100 Acute maxillary sinusitis, unspecified: Secondary | ICD-10-CM | POA: Diagnosis not present

## 2016-04-24 DIAGNOSIS — L400 Psoriasis vulgaris: Secondary | ICD-10-CM | POA: Diagnosis not present

## 2016-04-27 DIAGNOSIS — L400 Psoriasis vulgaris: Secondary | ICD-10-CM | POA: Diagnosis not present

## 2016-04-28 DIAGNOSIS — L400 Psoriasis vulgaris: Secondary | ICD-10-CM | POA: Diagnosis not present

## 2016-04-29 DIAGNOSIS — L400 Psoriasis vulgaris: Secondary | ICD-10-CM | POA: Diagnosis not present

## 2016-05-01 DIAGNOSIS — L400 Psoriasis vulgaris: Secondary | ICD-10-CM | POA: Diagnosis not present

## 2016-05-05 DIAGNOSIS — L400 Psoriasis vulgaris: Secondary | ICD-10-CM | POA: Diagnosis not present

## 2016-05-07 DIAGNOSIS — L400 Psoriasis vulgaris: Secondary | ICD-10-CM | POA: Diagnosis not present

## 2016-05-12 DIAGNOSIS — L400 Psoriasis vulgaris: Secondary | ICD-10-CM | POA: Diagnosis not present

## 2016-05-14 DIAGNOSIS — L400 Psoriasis vulgaris: Secondary | ICD-10-CM | POA: Diagnosis not present

## 2016-05-19 DIAGNOSIS — L400 Psoriasis vulgaris: Secondary | ICD-10-CM | POA: Diagnosis not present

## 2016-05-21 DIAGNOSIS — L720 Epidermal cyst: Secondary | ICD-10-CM | POA: Diagnosis not present

## 2016-05-21 DIAGNOSIS — L400 Psoriasis vulgaris: Secondary | ICD-10-CM | POA: Diagnosis not present

## 2016-05-25 DIAGNOSIS — L400 Psoriasis vulgaris: Secondary | ICD-10-CM | POA: Diagnosis not present

## 2016-05-27 DIAGNOSIS — L400 Psoriasis vulgaris: Secondary | ICD-10-CM | POA: Diagnosis not present

## 2016-05-29 DIAGNOSIS — L400 Psoriasis vulgaris: Secondary | ICD-10-CM | POA: Diagnosis not present

## 2016-06-03 DIAGNOSIS — L400 Psoriasis vulgaris: Secondary | ICD-10-CM | POA: Diagnosis not present

## 2016-06-05 DIAGNOSIS — L400 Psoriasis vulgaris: Secondary | ICD-10-CM | POA: Diagnosis not present

## 2016-06-08 DIAGNOSIS — L400 Psoriasis vulgaris: Secondary | ICD-10-CM | POA: Diagnosis not present

## 2016-06-10 DIAGNOSIS — L400 Psoriasis vulgaris: Secondary | ICD-10-CM | POA: Diagnosis not present

## 2016-06-10 IMAGING — US US RENAL
1 series · 14 of 25 positions shown · non-contrast
Comparison: CT abdomen and pelvis November 19, 2013

CLINICAL DATA: Stage III chronic renal disease

EXAM:
RENAL/URINARY TRACT ULTRASOUND COMPLETE

[Series 1: us renal · 0.41mm/px · 14 of 28 slices shown]
[im 1/28]
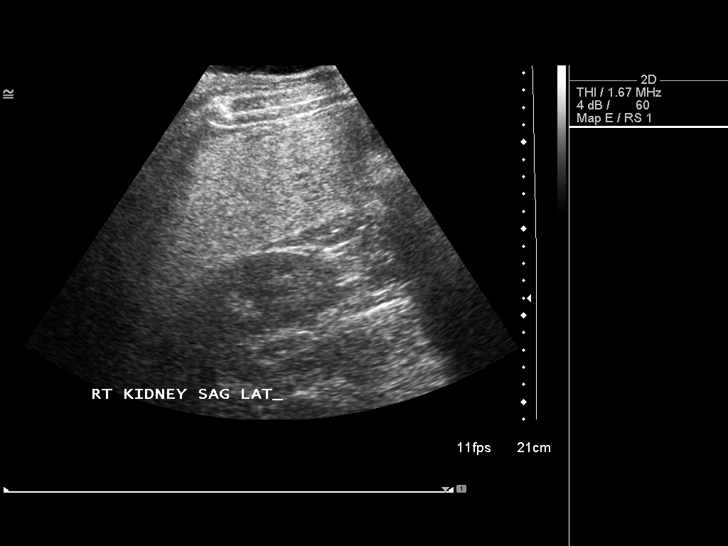
[im 3/28]
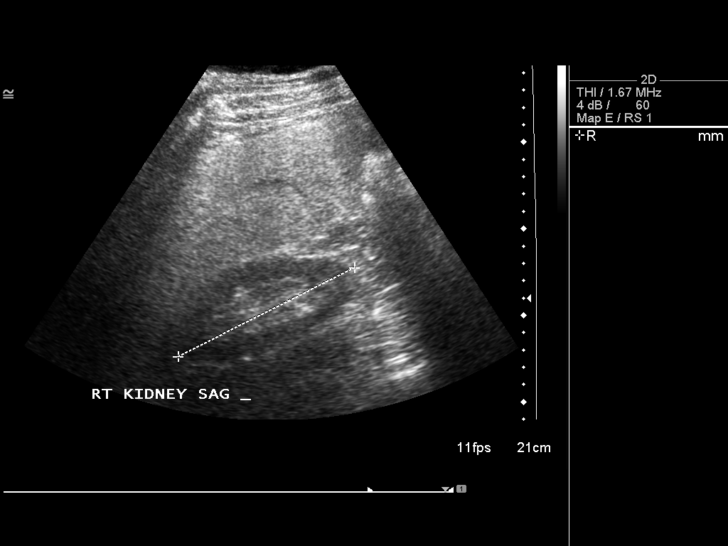
[im 5/28]
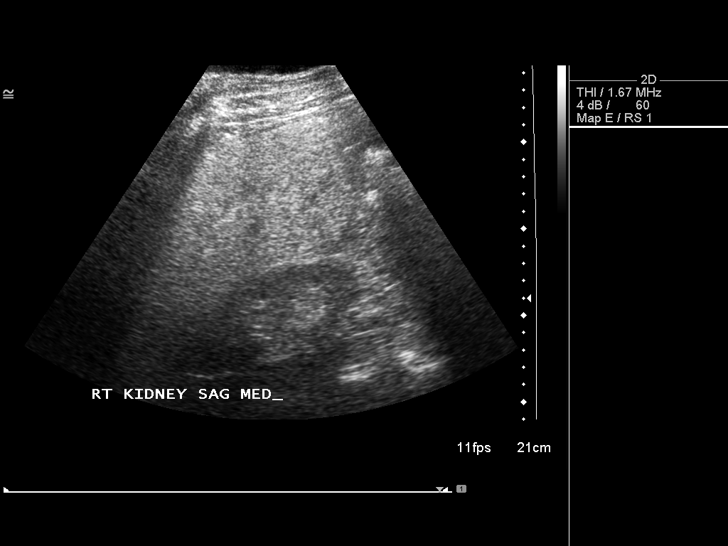
[im 7/28]
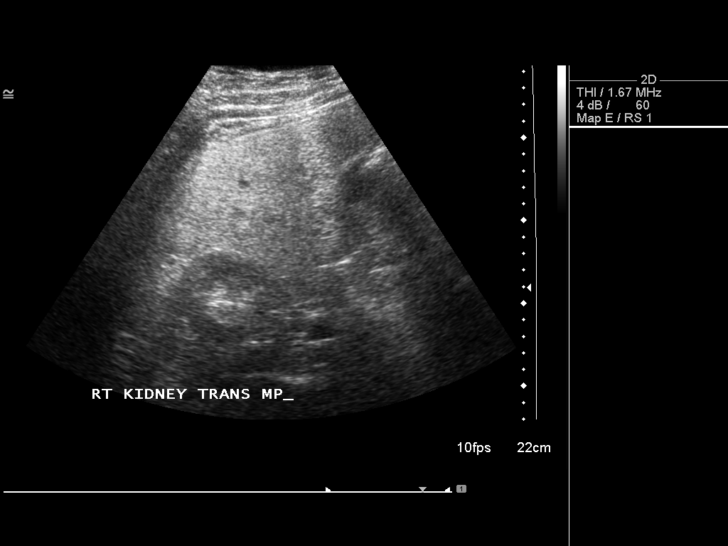
[im 10/28]
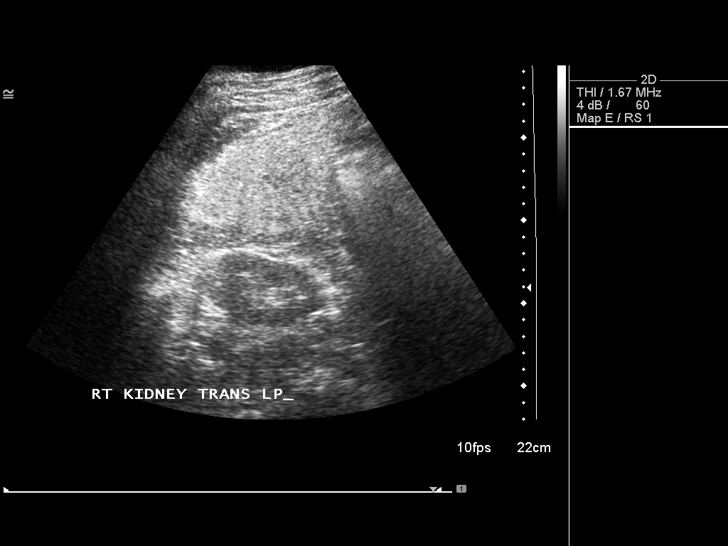
[im 11/28]
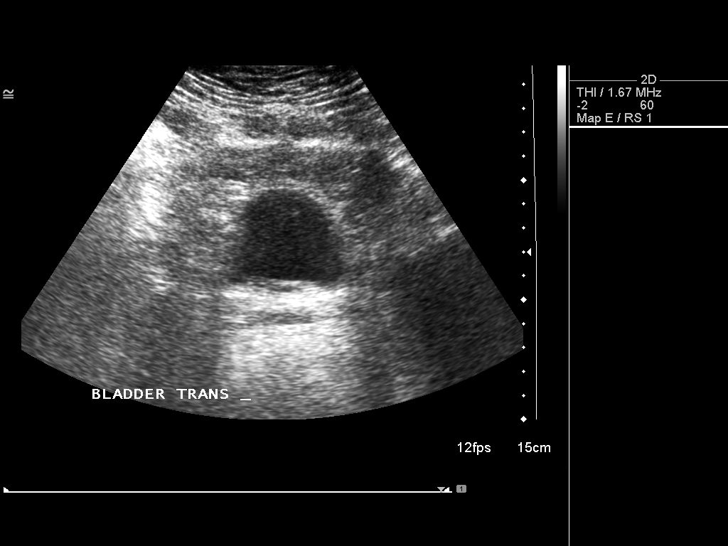
[im 13/28]
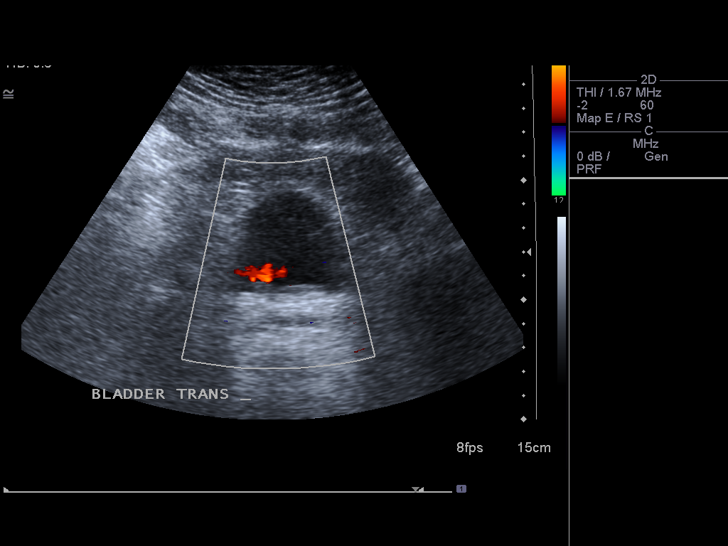
[im 15/28]
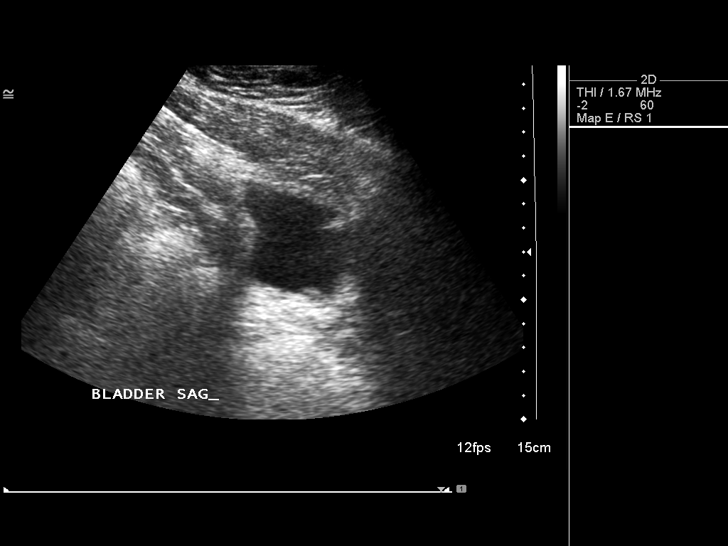
[im 17/28]
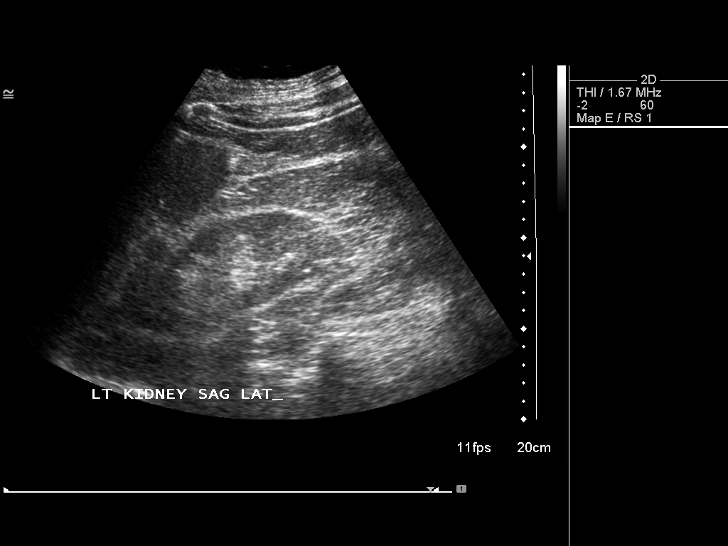
[im 19/28]
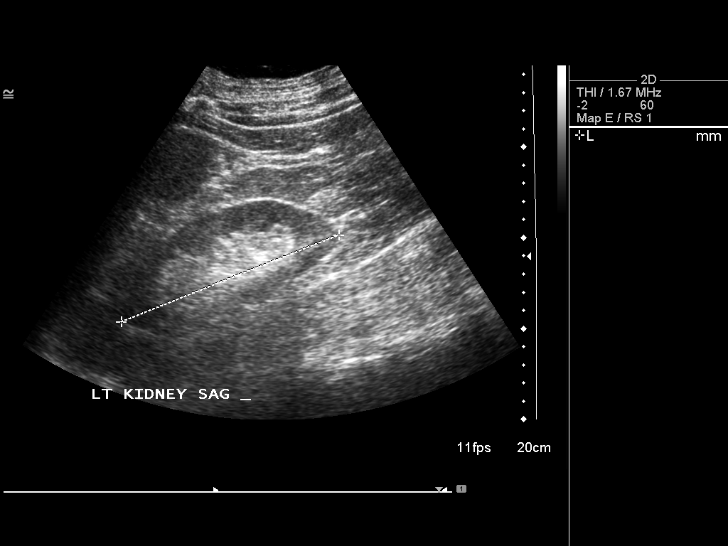
[im 21/28]
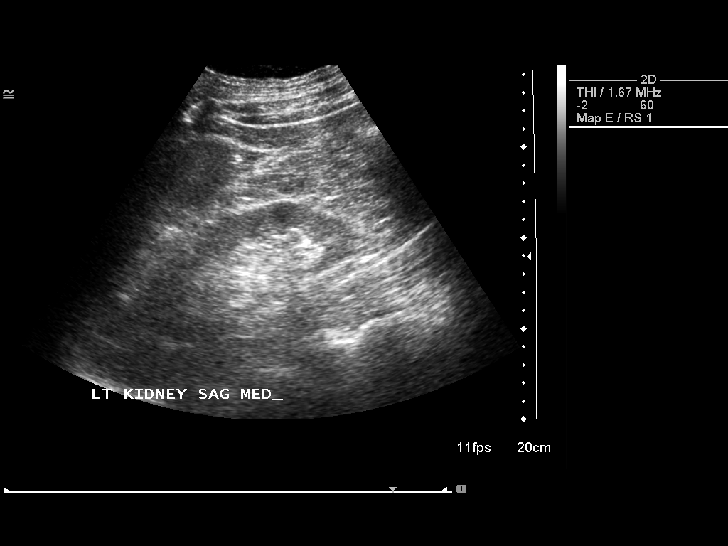
[im 23/28]
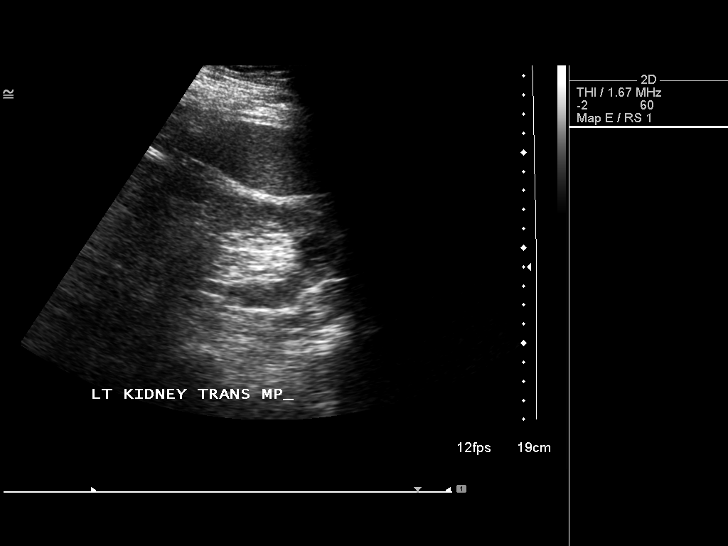
[im 25/28]
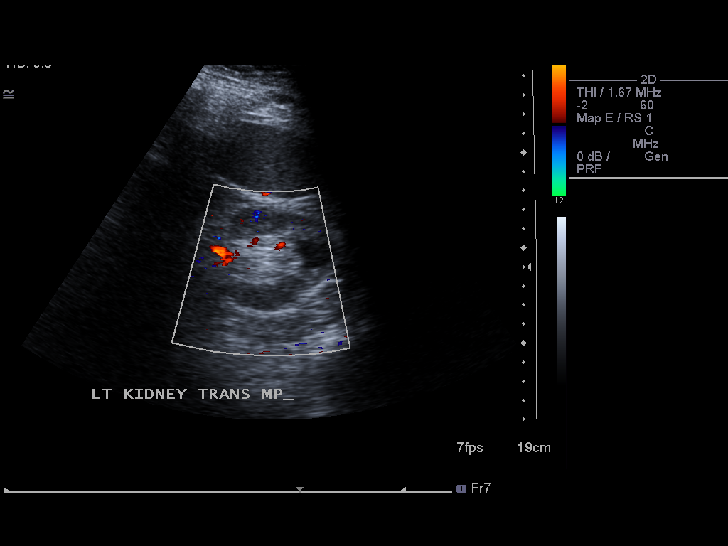
[im 28/28]
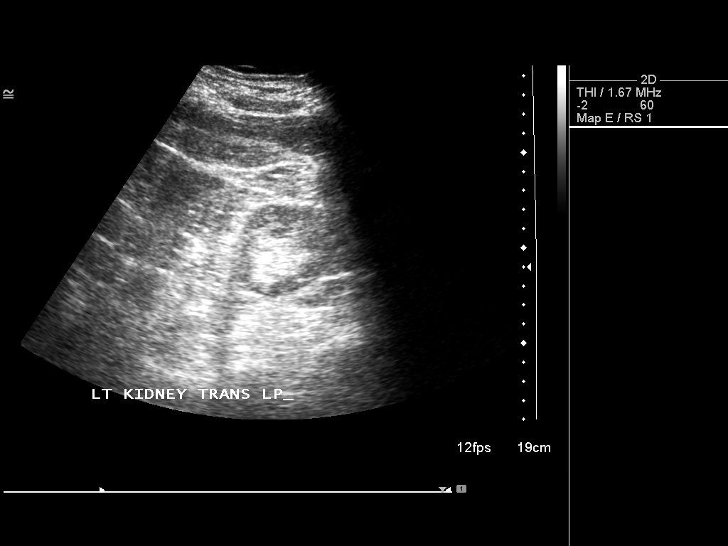

[14 of 25 positions shown; findings below may reference images not displayed]

FINDINGS: Right Kidney:

Length: 11.4 cm. Echogenicity and renal cortical thickness are
within normal limits. No mass, perinephric fluid, or hydronephrosis
visualized. No sonographically demonstrable calculus or
ureterectasis.

Left Kidney:

Length: 12.9 cm. Echogenicity and renal cortical thickness are
within normal limits. No perinephric fluid or hydronephrosis
visualized. There is a cyst arising from the midportion of the left
kidney laterally measuring 1.6 x 1.7 cm. No other renal mass is
identified. No sonographically demonstrable calculus or
ureterectasis.

Bladder:

Appears normal for degree of bladder distention.
IMPRESSION: 1.6 x 1.7 cm cyst in mid left kidney.  Study otherwise unremarkable.

## 2016-06-12 DIAGNOSIS — L400 Psoriasis vulgaris: Secondary | ICD-10-CM | POA: Diagnosis not present

## 2016-06-15 DIAGNOSIS — L400 Psoriasis vulgaris: Secondary | ICD-10-CM | POA: Diagnosis not present

## 2016-06-17 DIAGNOSIS — L400 Psoriasis vulgaris: Secondary | ICD-10-CM | POA: Diagnosis not present

## 2016-06-19 DIAGNOSIS — L400 Psoriasis vulgaris: Secondary | ICD-10-CM | POA: Diagnosis not present

## 2016-06-22 DIAGNOSIS — L400 Psoriasis vulgaris: Secondary | ICD-10-CM | POA: Diagnosis not present

## 2016-06-24 DIAGNOSIS — L400 Psoriasis vulgaris: Secondary | ICD-10-CM | POA: Diagnosis not present

## 2016-06-26 DIAGNOSIS — L400 Psoriasis vulgaris: Secondary | ICD-10-CM | POA: Diagnosis not present

## 2016-06-29 DIAGNOSIS — L400 Psoriasis vulgaris: Secondary | ICD-10-CM | POA: Diagnosis not present

## 2016-07-01 DIAGNOSIS — L400 Psoriasis vulgaris: Secondary | ICD-10-CM | POA: Diagnosis not present

## 2016-07-03 DIAGNOSIS — L400 Psoriasis vulgaris: Secondary | ICD-10-CM | POA: Diagnosis not present

## 2016-07-06 DIAGNOSIS — L400 Psoriasis vulgaris: Secondary | ICD-10-CM | POA: Diagnosis not present

## 2016-07-08 DIAGNOSIS — L400 Psoriasis vulgaris: Secondary | ICD-10-CM | POA: Diagnosis not present

## 2016-07-16 ENCOUNTER — Ambulatory Visit (INDEPENDENT_AMBULATORY_CARE_PROVIDER_SITE_OTHER): Payer: BLUE CROSS/BLUE SHIELD | Admitting: Podiatry

## 2016-07-16 ENCOUNTER — Encounter: Payer: Self-pay | Admitting: Podiatry

## 2016-07-16 DIAGNOSIS — L400 Psoriasis vulgaris: Secondary | ICD-10-CM | POA: Diagnosis not present

## 2016-07-16 DIAGNOSIS — M79673 Pain in unspecified foot: Secondary | ICD-10-CM

## 2016-07-16 DIAGNOSIS — B351 Tinea unguium: Secondary | ICD-10-CM

## 2016-07-16 DIAGNOSIS — M79674 Pain in right toe(s): Secondary | ICD-10-CM

## 2016-07-16 DIAGNOSIS — M79675 Pain in left toe(s): Secondary | ICD-10-CM | POA: Diagnosis not present

## 2016-07-17 NOTE — Progress Notes (Signed)
He presents today for chief complaint of elongated hallux nails which are thickened and hard to cut.  Objective: Toenails are dystrophic onychomycotic pulses are palpable. Sharp and rated now margins.  Assessment: Ingrown toenails which are dystrophic hallux bilateral.  Plan: Nail debridement follow-up with me as needed.

## 2016-07-20 DIAGNOSIS — L400 Psoriasis vulgaris: Secondary | ICD-10-CM | POA: Diagnosis not present

## 2016-07-23 DIAGNOSIS — L400 Psoriasis vulgaris: Secondary | ICD-10-CM | POA: Diagnosis not present

## 2016-07-27 DIAGNOSIS — L400 Psoriasis vulgaris: Secondary | ICD-10-CM | POA: Diagnosis not present

## 2016-07-28 ENCOUNTER — Encounter (HOSPITAL_COMMUNITY): Payer: Self-pay | Admitting: Emergency Medicine

## 2016-07-28 DIAGNOSIS — Z5321 Procedure and treatment not carried out due to patient leaving prior to being seen by health care provider: Secondary | ICD-10-CM | POA: Diagnosis not present

## 2016-07-28 DIAGNOSIS — R0602 Shortness of breath: Secondary | ICD-10-CM | POA: Diagnosis not present

## 2016-07-28 NOTE — ED Triage Notes (Signed)
Pt is c/o shortness of breath that started earlier today  Pt has used an inhaler and took Benzonadate 200mg  without relief  Pt is clammy in triage   Pt denies any chest pain or tightness

## 2016-07-29 ENCOUNTER — Emergency Department (HOSPITAL_COMMUNITY)
Admission: EM | Admit: 2016-07-29 | Discharge: 2016-07-29 | Disposition: A | Discharge status: Home / Self Care | Payer: BLUE CROSS/BLUE SHIELD | Attending: Emergency Medicine | Admitting: Emergency Medicine

## 2016-07-29 ENCOUNTER — Encounter: Payer: Self-pay | Admitting: Physician Assistant

## 2016-07-29 ENCOUNTER — Ambulatory Visit (INDEPENDENT_AMBULATORY_CARE_PROVIDER_SITE_OTHER): Payer: BLUE CROSS/BLUE SHIELD

## 2016-07-29 ENCOUNTER — Ambulatory Visit (INDEPENDENT_AMBULATORY_CARE_PROVIDER_SITE_OTHER): Payer: BLUE CROSS/BLUE SHIELD | Admitting: Physician Assistant

## 2016-07-29 VITALS — BP 133/99 | HR 74 | Temp 98.3°F | Resp 18 | Ht 76.5 in | Wt 322.8 lb

## 2016-07-29 DIAGNOSIS — R7309 Other abnormal glucose: Secondary | ICD-10-CM

## 2016-07-29 DIAGNOSIS — R05 Cough: Secondary | ICD-10-CM

## 2016-07-29 DIAGNOSIS — R0600 Dyspnea, unspecified: Secondary | ICD-10-CM | POA: Diagnosis not present

## 2016-07-29 DIAGNOSIS — R062 Wheezing: Secondary | ICD-10-CM | POA: Diagnosis not present

## 2016-07-29 DIAGNOSIS — R059 Cough, unspecified: Secondary | ICD-10-CM

## 2016-07-29 DIAGNOSIS — Z716 Tobacco abuse counseling: Secondary | ICD-10-CM

## 2016-07-29 HISTORY — DX: Essential (primary) hypertension: I10

## 2016-07-29 LAB — POCT CBC
GRANULOCYTE PERCENT: 68.9 % (ref 37–80)
HCT, POC: 47 % (ref 43.5–53.7)
Hemoglobin: 16.5 g/dL (ref 14.1–18.1)
Lymph, poc: 1.5 (ref 0.6–3.4)
MCH, POC: 33.6 pg — AB (ref 27–31.2)
MCHC: 35.1 g/dL (ref 31.8–35.4)
MCV: 95.7 fL (ref 80–97)
MID (cbc): 0.7 (ref 0–0.9)
MPV: 8.3 fL (ref 0–99.8)
POC Granulocyte: 5 (ref 2–6.9)
POC LYMPH PERCENT: 21.2 %L (ref 10–50)
POC MID %: 9.9 % (ref 0–12)
Platelet Count, POC: 107 10*3/uL — AB (ref 142–424)
RBC: 4.9 M/uL (ref 4.69–6.13)
RDW, POC: 13.5 %
WBC: 7.3 10*3/uL (ref 4.6–10.2)

## 2016-07-29 LAB — BASIC METABOLIC PANEL
BUN: 18 mg/dL (ref 7–25)
CO2: 27 mmol/L (ref 20–31)
CREATININE: 1.49 mg/dL — AB (ref 0.70–1.33)
Calcium: 9.3 mg/dL (ref 8.6–10.3)
Chloride: 102 mmol/L (ref 98–110)
Glucose, Bld: 154 mg/dL — ABNORMAL HIGH (ref 65–99)
Potassium: 4.5 mmol/L (ref 3.5–5.3)
Sodium: 137 mmol/L (ref 135–146)

## 2016-07-29 LAB — HEMOGLOBIN A1C
HEMOGLOBIN A1C: 7.4 % — AB (ref <5.7)
MEAN PLASMA GLUCOSE: 166 mg/dL

## 2016-07-29 LAB — GLUCOSE, POCT (MANUAL RESULT ENTRY): POC GLUCOSE: 141 mg/dL — AB (ref 70–99)

## 2016-07-29 MED ORDER — GUAIFENESIN ER 1200 MG PO TB12: 1.0000 | ORAL_TABLET | Freq: Two times a day (BID) | ORAL | 1 refills | Status: DC | PRN

## 2016-07-29 MED ORDER — ALBUTEROL SULFATE (2.5 MG/3ML) 0.083% IN NEBU
2.5000 mg | INHALATION_SOLUTION | Freq: Once | RESPIRATORY_TRACT | Status: AC
  Administered 2016-07-29: 2.5 mg via RESPIRATORY_TRACT

## 2016-07-29 MED ORDER — METHYLPREDNISOLONE SODIUM SUCC 125 MG IJ SOLR
125.0000 mg | Freq: Once | INTRAMUSCULAR | Status: AC
  Administered 2016-07-29: 125 mg via INTRAMUSCULAR

## 2016-07-29 MED ORDER — ALBUTEROL SULFATE HFA 108 (90 BASE) MCG/ACT IN AERS
2.0000 | INHALATION_SPRAY | RESPIRATORY_TRACT | 1 refills | Status: DC | PRN
Start: 2016-07-29 — End: 2016-07-29

## 2016-07-29 MED ORDER — IPRATROPIUM BROMIDE 0.02 % IN SOLN
0.5000 mg | Freq: Once | RESPIRATORY_TRACT | Status: AC
  Administered 2016-07-29: 0.5 mg via RESPIRATORY_TRACT

## 2016-07-29 MED ORDER — VARENICLINE TARTRATE 0.5 MG PO TABS: 0.5000 mg | ORAL_TABLET | Freq: Two times a day (BID) | ORAL | 2 refills | Status: DC

## 2016-07-29 MED ORDER — IPRATROPIUM BROMIDE 0.03 % NA SOLN: 2.0000 | Freq: Two times a day (BID) | NASAL | 0 refills | Status: DC

## 2016-07-29 MED ORDER — PREDNISONE 20 MG PO TABS: ORAL_TABLET | ORAL | 0 refills | Status: DC

## 2016-07-29 NOTE — ED Triage Notes (Signed)
Called to take to treatment room  No response from lobby 

## 2016-07-29 NOTE — Patient Instructions (Addendum)
Please return to your primary care regarding the abnormal ekg.  Please return within the next month for follow up of this urgent care appointment.   I would also like her to refer your to a pulmonologist.  If there is any difficulty obtaining this, I would like you to contact us so that I may send for the referral. I would like you to follow up in the next 48 hours for recheck.  Please hydrate well with 64 oz of water only.    You Can Quit Smoking If you are ready to quit smoking or are thinking about it, congratulations! You have chosen to help yourself be healthier and live longer! There are lots of different ways to quit smoking. Nicotine gum, nicotine patches, a nicotine inhaler, or nicotine nasal spray can help with physical craving. Hypnosis, support groups, and medicines help break the habit of smoking. TIPS TO GET OFF AND STAY OFF CIGARETTES  Learn to predict your moods. Do not let a bad situation be your excuse to have a cigarette. Some situations in your life might tempt you to have a cigarette.  Ask friends and co-workers not to smoke around you.  Make your home smoke-free.  Never have "just one" cigarette. It leads to wanting another and another. Remind yourself of your decision to quit.  On a card, make a list of your reasons for not smoking. Read it at least the same number of times a day as you have a cigarette. Tell yourself everyday, "I do not want to smoke. I choose not to smoke."  Ask someone at home or work to help you with your plan to quit smoking.  Have something planned after you eat or have a cup of coffee. Take a walk or get other exercise to perk you up. This will help to keep you from overeating.  Try a relaxation exercise to calm you down and decrease your stress. Remember, you may be tense and nervous the first two weeks after you quit. This will pass.  Find new activities to keep your hands busy. Play with a pen, coin, or rubber band. Doodle or draw things on  paper.  Brush your teeth right after eating. This will help cut down the craving for the taste of tobacco after meals. You can try mouthwash too.  Try gum, breath mints, or diet candy to keep something in your mouth. IF YOU SMOKE AND WANT TO QUIT:  Do not stock up on cigarettes. Never buy a carton. Wait until one pack is finished before you buy another.  Never carry cigarettes with you at work or at home.  Keep cigarettes as far away from you as possible. Leave them with someone else.  Never carry matches or a lighter with you.  Ask yourself, "Do I need this cigarette or is this just a reflex?"  Bet with someone that you can quit. Put cigarette money in a piggy bank every morning. If you smoke, you give up the money. If you do not smoke, by the end of the week, you keep the money.  Keep trying. It takes 21 days to change a habit!  Talk to your doctor about using medicines to help you quit. These include nicotine replacement gum, lozenges, or skin patches.   This information is not intended to replace advice given to you by your health care provider. Make sure you discuss any questions you have with your health care provider.   Document Released: 09/26/2009 Document Revised: 02/22/2012 Document  Reviewed: 09/26/2009 Elsevier Interactive Patient Education [employer]
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
## 2016-07-29 NOTE — ED Triage Notes (Signed)
Called for second time  No response from lobby 

## 2016-07-30 DIAGNOSIS — J209 Acute bronchitis, unspecified: Secondary | ICD-10-CM | POA: Diagnosis not present

## 2016-07-30 DIAGNOSIS — E669 Obesity, unspecified: Secondary | ICD-10-CM | POA: Diagnosis not present

## 2016-07-30 DIAGNOSIS — Z720 Tobacco use: Secondary | ICD-10-CM | POA: Diagnosis not present

## 2016-07-31 DIAGNOSIS — L400 Psoriasis vulgaris: Secondary | ICD-10-CM | POA: Diagnosis not present

## 2016-08-03 DIAGNOSIS — L400 Psoriasis vulgaris: Secondary | ICD-10-CM | POA: Diagnosis not present

## 2016-08-05 DIAGNOSIS — L400 Psoriasis vulgaris: Secondary | ICD-10-CM | POA: Diagnosis not present

## 2016-08-07 DIAGNOSIS — L400 Psoriasis vulgaris: Secondary | ICD-10-CM | POA: Diagnosis not present

## 2016-08-13 DIAGNOSIS — L400 Psoriasis vulgaris: Secondary | ICD-10-CM | POA: Diagnosis not present

## 2016-08-20 DIAGNOSIS — L400 Psoriasis vulgaris: Secondary | ICD-10-CM | POA: Diagnosis not present

## 2016-08-24 DIAGNOSIS — L400 Psoriasis vulgaris: Secondary | ICD-10-CM | POA: Diagnosis not present

## 2016-08-26 DIAGNOSIS — L400 Psoriasis vulgaris: Secondary | ICD-10-CM | POA: Diagnosis not present

## 2016-08-28 ENCOUNTER — Encounter: Payer: Self-pay | Admitting: Internal Medicine

## 2016-08-28 ENCOUNTER — Ambulatory Visit (INDEPENDENT_AMBULATORY_CARE_PROVIDER_SITE_OTHER): Payer: BLUE CROSS/BLUE SHIELD | Admitting: Internal Medicine

## 2016-08-28 VITALS — BP 142/70 | HR 73 | Ht 77.0 in | Wt 323.0 lb

## 2016-08-28 DIAGNOSIS — Z87891 Personal history of nicotine dependence: Secondary | ICD-10-CM

## 2016-08-28 DIAGNOSIS — L400 Psoriasis vulgaris: Secondary | ICD-10-CM | POA: Diagnosis not present

## 2016-08-28 DIAGNOSIS — R05 Cough: Secondary | ICD-10-CM

## 2016-08-28 DIAGNOSIS — R053 Chronic cough: Secondary | ICD-10-CM

## 2016-08-28 MED ORDER — BISOPROLOL FUMARATE 5 MG PO TABS: 5.0000 mg | ORAL_TABLET | Freq: Every day | ORAL | 0 refills | Status: DC

## 2016-08-28 NOTE — Patient Instructions (Addendum)
ICD-9-CM ICD-10-CM   1. Chronic cough 786.2 R05   2. History of smoking 30 or more pack years V15.82 Z87.891     #Cough  - stop lisinopril - start bisoprolol 5mg  per day (take samples); further adjustments for BP by Dr Neldon LabellaMILLER,LISA LYNN, MD  #SMoking history  - do PFT at your convenience next few weeks (CXR aug 2017 - clear) - eligible for lung cancer screening after 55 years  #followup  - have PFT and return to see me or an APP next few weeks - wil see progress with cough as well at that time

## 2016-08-28 NOTE — Progress Notes (Signed)
Subjective:    Patient ID: Dennis Sims, male    DOB: 1966-08-17, 50 y.o.   MRN: [id number]  PCP Neldon LabellaMILLER,LISA LYNN, MD   HPI  IOV 08/28/2016  Chief Complaint  Patient presents with  . Pulmonary Consult    Pt referred by Dr. Sigmund HazelLisa Miller for bronchitis, quesitonable COPD. Pt states he is back to baseline. Pt c/o occassional nonprod cough. Pt is an Corporate investment bankerexsmoker.    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 Reports lifelong history of smoking 1.5 pack per day for 25 years. Approximately 6 months ago was started on lisinopril for hypertension and then approximately 4-5 months ago developed chronic cough of insidious onset. He was advised by the pharmacist that this might be related to lisinopril. He was discussing this with his primary care physician but in the midst of all this approximately a month ago developed acute bronchitis [confirmed according to his history and also review of the chart]. It appears he was treated with  ZPak date since then he is improved. In fact acute bronchitis is resolved. He is left with his chronic residual mild cough which is dry. He also is extremely mild shortness of breath any exertion which he attributes to his weight. Because of the bronchitis he quit smoking 3 weeks ago. He is here for pulmonologist evaluation. Chest x-ray 07/29/2016 personally visualized is clear      has a past medical history of Diverticulitis; Hypertension; Plantar fasciitis; and Sleep apnea.   reports that he quit smoking about 3 weeks ago. His smoking use included Cigarettes. He has a 37.50 pack-year smoking history. He has never used smokeless tobacco.  Past Surgical History:  Procedure Laterality Date  . colonscopy    . DENTAL SURGERY    . FOOT SURGERY      Allergies  Allergen Reactions  . Keflex [Cephalexin] Other (See Comments)    Upset stomach, felt overly tired and felt "crappy"  . Oxycodone Rash    ITCHING AND UNABLE TO SLEEP          There is no immunization history on file for this patient.  Family History  Problem Relation Age of Onset  . Bipolar disorder Brother   . Heart disease Father   . Colon cancer Father      Current Outpatient Prescriptions:  .  lisinopril (PRINIVIL,ZESTRIL) 20 MG tablet, Take 20 mg by mouth daily., Disp: , Rfl:  .  MELATONIN PO, Take by mouth at bedtime as needed., Disp: , Rfl:  .  TACLONEX external suspension, , Disp: , Rfl:  .  varenicline (CHANTIX) 0.5 MG tablet, Take 1 tablet (0.5 mg total) by mouth 2 (two) times daily. Start initial .5 daily for 3 days, then increase to the twice per day.  Max. 2mg  per day.  Take with food., Disp: 60 tablet, Rfl: 2     Review of Systems  Constitutional: Negative for fever and unexpected weight change.  HENT: Negative for congestion, dental problem, ear pain, nosebleeds, postnasal drip, rhinorrhea, sinus pressure, sneezing, sore throat and trouble swallowing.   Eyes: Negative for redness and itching.  Respiratory: Positive for cough and shortness of breath. Negative for chest tightness and wheezing.   Cardiovascular: Negative for palpitations and leg swelling.  Gastrointestinal: Negative for nausea and vomiting.  Genitourinary: Negative for dysuria.  Musculoskeletal: Negative for joint swelling.  Skin: Negative for rash.  Neurological: Negative for headaches.  Hematological: Does not bruise/bleed easily.  Psychiatric/Behavioral: Negative for dysphoric  mood. The patient is not nervous/anxious.        Objective:   Physical Exam  Constitutional: He is oriented to person, place, and time. He appears well-developed and well-nourished. No distress.  Body mass index is 38.3 kg/m.   HENT:  Head: Normocephalic and atraumatic.  Right Ear: External ear normal.  Left Ear: External ear normal.  Mouth/Throat: Oropharynx is clear and moist. No oropharyngeal exudate.  mallampatti class 2-3  Eyes: Conjunctivae and EOM are normal. Pupils are  equal, round, and reactive to light. Right eye exhibits no discharge. Left eye exhibits no discharge. No scleral icterus.  Neck: Normal range of motion. Neck supple. No JVD present. No tracheal deviation present. No thyromegaly present.  Cardiovascular: Normal rate, regular rhythm and intact distal pulses.  Exam reveals no gallop and no friction rub.   No murmur heard. Pulmonary/Chest: Effort normal and breath sounds normal. No respiratory distress. He has no wheezes. He has no rales. He exhibits no tenderness.  Abdominal: Soft. Bowel sounds are normal. He exhibits no distension and no mass. There is no tenderness. There is no rebound and no guarding.  Visceral obesity+  Musculoskeletal: Normal range of motion. He exhibits no edema or tenderness.  Lymphadenopathy:    He has no cervical adenopathy.  Neurological: He is alert and oriented to person, place, and time. He has normal reflexes. No cranial nerve deficit. Coordination normal.  Skin: Skin is warm and dry. No rash noted. He is not diaphoretic. No erythema. No pallor.  Psychiatric: He has a normal mood and affect. His behavior is normal. Judgment and thought content normal.  Nursing note and vitals reviewed.   Vitals:   08/28/16 1026  BP: (!) 142/70  Pulse: 73  SpO2: 95%  Weight: (!) 323 lb (146.5 kg)  Height: 6\' 5"  (1.956 m)    Estimated body mass index is 38.3 kg/m as calculated from the following:   Height as of this encounter: 6\' 5"  (1.956 m).   Weight as of this encounter: 323 lb (146.5 kg).      Assessment & Plan:     ICD-9-CM ICD-10-CM   1. Chronic cough 786.2 R05   2. History of smoking 30 or more pack years V15.82 Z87.891      #Cough  - stop lisinopril - start bisoprolol 5mg  per day (take samples); further adjustments for BP by Dr Neldon Labella, MD  #SMoking history  - do PFT at your convenience next few weeks (CXR aug 2017 - clear) - eligible for lung cancer screening after 55 years  #followup  -  have PFT and return to see me or an APP next few weeks - wil see progress with cough as well at that time     Dr. Kalman Shan, M.D., [facility].C.P Pulmonary and Critical Care Medicine Staff Physician [facility] Pager: [phone], If no answer or between  15:00h - 7:00h: call [phone]  08/28/2016 11:04 AM

## 2016-09-01 DIAGNOSIS — L400 Psoriasis vulgaris: Secondary | ICD-10-CM | POA: Diagnosis not present

## 2016-09-03 DIAGNOSIS — L400 Psoriasis vulgaris: Secondary | ICD-10-CM | POA: Diagnosis not present

## 2016-09-07 DIAGNOSIS — L400 Psoriasis vulgaris: Secondary | ICD-10-CM | POA: Diagnosis not present

## 2016-09-08 ENCOUNTER — Ambulatory Visit (INDEPENDENT_AMBULATORY_CARE_PROVIDER_SITE_OTHER): Payer: BLUE CROSS/BLUE SHIELD | Admitting: Internal Medicine

## 2016-09-08 DIAGNOSIS — R05 Cough: Secondary | ICD-10-CM | POA: Diagnosis not present

## 2016-09-08 DIAGNOSIS — R053 Chronic cough: Secondary | ICD-10-CM

## 2016-09-08 DIAGNOSIS — Z87891 Personal history of nicotine dependence: Secondary | ICD-10-CM

## 2016-09-08 LAB — PULMONARY FUNCTION TEST
DL/VA % pred: 95 %
DL/VA: 4.72 ml/min/mmHg/L
DLCO UNC % PRED: 79 %
DLCO UNC: 31.99 ml/min/mmHg
DLCO cor % pred: 78 %
DLCO cor: 31.81 ml/min/mmHg
FEF 25-75 POST: 1.54 L/s
FEF 25-75 Pre: 1.34 L/sec
FEF2575-%Change-Post: 15 %
FEF2575-%Pred-Post: 39 %
FEF2575-%Pred-Pre: 33 %
FEV1-%CHANGE-POST: 7 %
FEV1-%PRED-POST: 55 %
FEV1-%PRED-PRE: 51 %
FEV1-POST: 2.59 L
FEV1-Pre: 2.4 L
FEV1FVC-%Change-Post: 7 %
FEV1FVC-%Pred-Pre: 77 %
FEV6-%Change-Post: 1 %
FEV6-%Pred-Post: 68 %
FEV6-%Pred-Pre: 67 %
FEV6-Post: 4.01 L
FEV6-Pre: 3.95 L
FEV6FVC-%Change-Post: 0 %
FEV6FVC-%Pred-Post: 102 %
FEV6FVC-%Pred-Pre: 101 %
FVC-%Change-Post: 0 %
FVC-%PRED-PRE: 66 %
FVC-%Pred-Post: 66 %
FVC-POST: 4.04 L
FVC-PRE: 4.02 L
POST FEV1/FVC RATIO: 64 %
PRE FEV6/FVC RATIO: 98 %
Post FEV6/FVC ratio: 99 %
Pre FEV1/FVC ratio: 60 %
RV % pred: -47 %
RV: -1.13 L
TLC % pred: 81 %
TLC: 6.7 L

## 2016-09-09 DIAGNOSIS — L400 Psoriasis vulgaris: Secondary | ICD-10-CM | POA: Diagnosis not present

## 2016-09-09 DIAGNOSIS — F4323 Adjustment disorder with mixed anxiety and depressed mood: Secondary | ICD-10-CM | POA: Diagnosis not present

## 2016-09-11 DIAGNOSIS — L400 Psoriasis vulgaris: Secondary | ICD-10-CM | POA: Diagnosis not present

## 2016-09-14 DIAGNOSIS — L400 Psoriasis vulgaris: Secondary | ICD-10-CM | POA: Diagnosis not present

## 2016-09-16 DIAGNOSIS — L400 Psoriasis vulgaris: Secondary | ICD-10-CM | POA: Diagnosis not present

## 2016-09-18 DIAGNOSIS — L400 Psoriasis vulgaris: Secondary | ICD-10-CM | POA: Diagnosis not present

## 2016-09-21 ENCOUNTER — Ambulatory Visit: Payer: BLUE CROSS/BLUE SHIELD | Admitting: Internal Medicine

## 2016-09-21 DIAGNOSIS — L400 Psoriasis vulgaris: Secondary | ICD-10-CM | POA: Diagnosis not present

## 2016-09-22 ENCOUNTER — Encounter: Payer: Self-pay | Admitting: Internal Medicine

## 2016-09-22 ENCOUNTER — Ambulatory Visit (INDEPENDENT_AMBULATORY_CARE_PROVIDER_SITE_OTHER): Payer: BLUE CROSS/BLUE SHIELD | Admitting: Internal Medicine

## 2016-09-22 VITALS — BP 146/82 | HR 51 | Ht 77.0 in | Wt 322.4 lb

## 2016-09-22 DIAGNOSIS — J4540 Moderate persistent asthma, uncomplicated: Secondary | ICD-10-CM | POA: Diagnosis not present

## 2016-09-22 DIAGNOSIS — I10 Essential (primary) hypertension: Secondary | ICD-10-CM | POA: Diagnosis not present

## 2016-09-22 DIAGNOSIS — Z23 Encounter for immunization: Secondary | ICD-10-CM

## 2016-09-22 DIAGNOSIS — R053 Chronic cough: Secondary | ICD-10-CM

## 2016-09-22 DIAGNOSIS — R05 Cough: Secondary | ICD-10-CM | POA: Diagnosis not present

## 2016-09-22 MED ORDER — FLUTICASONE FUROATE-VILANTEROL 100-25 MCG/INH IN AEPB: 1.0000 | INHALATION_SPRAY | Freq: Every day | RESPIRATORY_TRACT | 0 refills | Status: DC

## 2016-09-22 NOTE — Patient Instructions (Addendum)
ICD-9-CM ICD-10-CM   1. Chronic cough 786.2 R05   2. Moderate persistent asthma without complication 493.90 J45.40   3. Essential hypertension, benign 401.1 I10     Glad you are better after stopping lisnopril but not fully resolved You have asthma on breathing test (might be copd but for now we will call it asthma) BP still high  PLAN - increase bisoprolol to 10mg   per day  further adjustments for BP by Dr Neldon LabellaMILLER,Dennis LYNN, Dennis Sims - eligible for lung cancer screening after 55 years - flu shot 09/22/2016 - alpha 1 check 09/22/2016 - start breo low dose once daily - rinse mouth after use - use albuterol as needed  Followup 2- 3months or sooner if needed to monitor progress  - return sooner if worse
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
## 2016-09-23 DIAGNOSIS — L400 Psoriasis vulgaris: Secondary | ICD-10-CM | POA: Diagnosis not present

## 2016-09-25 DIAGNOSIS — L820 Inflamed seborrheic keratosis: Secondary | ICD-10-CM | POA: Diagnosis not present

## 2016-09-25 DIAGNOSIS — L304 Erythema intertrigo: Secondary | ICD-10-CM | POA: Diagnosis not present

## 2016-09-25 DIAGNOSIS — L400 Psoriasis vulgaris: Secondary | ICD-10-CM | POA: Diagnosis not present

## 2016-09-25 DIAGNOSIS — L918 Other hypertrophic disorders of the skin: Secondary | ICD-10-CM | POA: Diagnosis not present

## 2016-09-29 DIAGNOSIS — L400 Psoriasis vulgaris: Secondary | ICD-10-CM | POA: Diagnosis not present

## 2016-09-30 ENCOUNTER — Other Ambulatory Visit: Payer: Self-pay | Admitting: Internal Medicine

## 2016-10-01 ENCOUNTER — Telehealth: Payer: Self-pay | Admitting: Internal Medicine

## 2016-10-01 DIAGNOSIS — L400 Psoriasis vulgaris: Secondary | ICD-10-CM | POA: Diagnosis not present

## 2016-10-01 MED ORDER — BISOPROLOL FUMARATE 10 MG PO TABS: 10.0000 mg | ORAL_TABLET | Freq: Every day | ORAL | 1 refills | Status: DC

## 2016-10-01 NOTE — Telephone Encounter (Signed)
Pt's increased rx for bisoprolol was not called in to pharmacy at last OV.  This has been sent in.  Nothing further needed.

## 2016-10-02 ENCOUNTER — Emergency Department (HOSPITAL_COMMUNITY)
Admission: EM | Admit: 2016-10-02 | Discharge: 2016-10-02 | Disposition: A | Discharge status: Home / Self Care | Payer: BLUE CROSS/BLUE SHIELD | Attending: Emergency Medicine | Admitting: Emergency Medicine

## 2016-10-02 ENCOUNTER — Encounter (HOSPITAL_COMMUNITY): Payer: Self-pay | Admitting: Emergency Medicine

## 2016-10-02 ENCOUNTER — Ambulatory Visit (INDEPENDENT_AMBULATORY_CARE_PROVIDER_SITE_OTHER): Payer: BLUE CROSS/BLUE SHIELD | Admitting: Family Medicine

## 2016-10-02 ENCOUNTER — Emergency Department (HOSPITAL_COMMUNITY): Payer: BLUE CROSS/BLUE SHIELD

## 2016-10-02 VITALS — BP 156/80 | HR 82 | Temp 98.2°F | Resp 18 | Ht 75.5 in | Wt 321.6 lb

## 2016-10-02 DIAGNOSIS — Y939 Activity, unspecified: Secondary | ICD-10-CM | POA: Insufficient documentation

## 2016-10-02 DIAGNOSIS — S0230XA Fracture of orbital floor, unspecified side, initial encounter for closed fracture: Secondary | ICD-10-CM | POA: Diagnosis not present

## 2016-10-02 DIAGNOSIS — H538 Other visual disturbances: Secondary | ICD-10-CM | POA: Diagnosis not present

## 2016-10-02 DIAGNOSIS — Z79899 Other long term (current) drug therapy: Secondary | ICD-10-CM | POA: Insufficient documentation

## 2016-10-02 DIAGNOSIS — I10 Essential (primary) hypertension: Secondary | ICD-10-CM | POA: Insufficient documentation

## 2016-10-02 DIAGNOSIS — Z87891 Personal history of nicotine dependence: Secondary | ICD-10-CM | POA: Diagnosis not present

## 2016-10-02 DIAGNOSIS — S0993XA Unspecified injury of face, initial encounter: Secondary | ICD-10-CM

## 2016-10-02 DIAGNOSIS — J45909 Unspecified asthma, uncomplicated: Secondary | ICD-10-CM | POA: Insufficient documentation

## 2016-10-02 DIAGNOSIS — Y999 Unspecified external cause status: Secondary | ICD-10-CM | POA: Insufficient documentation

## 2016-10-02 DIAGNOSIS — S0083XA Contusion of other part of head, initial encounter: Secondary | ICD-10-CM | POA: Diagnosis not present

## 2016-10-02 DIAGNOSIS — Y929 Unspecified place or not applicable: Secondary | ICD-10-CM | POA: Diagnosis not present
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
## 2016-10-02 NOTE — Patient Instructions (Addendum)
Go to the ER for further evaluation.    Orbital Floor Fracture, Blowout The orbit, also called the eye socket, is a bony structure that protects the eye. The bottom wall of the orbit is called the orbital floor. It separates the orbit from a sinus. An orbital floor fracture is a break in the orbital floor. This type of fracture is called a "blowout" when tissues around the eye, including the muscle that is used to make the eye look down, becomes trapped within the fracture. CAUSES  An orbital floor fracture is caused by a direct blow (blunt trauma) to the eye from the front. SIGNS AND SYMPTOMS   A black eye.  Swelling and bruising around the eye.  A gurgling sound when pressure is placed on the eye area.  Seeing two of everything, with one object appearing higher than the other (vertical diplopia). The vertical diplopia is worse when looking up.  Pain around the eye when looking up.  One eye looks sunken compared to the other eye.  Numbness of the cheek and upper gum on the same side of the face as was injured. DIAGNOSIS  A diagnosis is made with an eye exam. It is confirmed with X-rays or a CT scan of the eye. TREATMENT  You may be prescribed medicines such as antibiotics, steroids, or decongestants. The fracture itself is usually not treated until all the swelling around the eye has gone away. This may take 1-2 weeks. After the swelling has gone away:  If your eye is not trapped within the fracture, you will not need treatment.  If you have persistent vertical double vision, your health care provider may try to free the muscle. If he or she cannot, you may need to have surgery.  If you have double vision, but only when looking up, your health care provider will discuss treatment options with you. Some people who do not spend a lot of time looking up choose not to have additional treatment. Others who need to look up often, such as electricians, need treatment. HOME CARE  INSTRUCTIONS  Keep all follow-up visits as directed by your health care provider. This is important.  Take medicines only as directed by your health care provider.  Follow your health care provider's instructions about:  Using ice packs or cold compresses to decrease swelling.  Sleeping with your head elevated.  Always follow recommendations about the wearing of protective glasses or goggles.  Do not wear contact lenses until your health care provider says it is okay.  Do not drive or perform your regular activities without your health care provider's approval. Be aware that if you are only using one eye to see, you may have difficulty with depth perception and the ability to judge distance.  Do not blow your nose.  Stay away from dusty areas.  Avoid traveling by plane or going to high-altitude areas. This may slow the healing of your swelling and increase sinus pain. SEEK MEDICAL CARE IF:  Your vision changes.  The redness or swelling around the injured eye does not go away or becomes worse.  Blood or discolored discharge comes from your nose.  You have a fever. SEEK IMMEDIATE MEDICAL CARE IF:   You have a sensation that you are seeing flashing lights.  You have sudden blindness. MAKE SURE YOU:  Understand these instructions.  Will watch your condition.  Will get help right away if you are not doing well or get worse.   This information is not  intended to replace advice given to you by your health care provider. Make sure you discuss any questions you have with your health care provider.   Document Released: 05/26/2001 Document Revised: 12/21/2014 Document Reviewed: 02/01/2014 Elsevier Interactive Patient Education 2016 [employer]    IF you received an x-ray today, you will receive an invoice from [facility]. Please contact [facility] at [phone] with questions or concerns regarding your invoice.   IF you received labwork today, you  will receive an invoice from [facility]. Please contact [facility] at [phone] with questions or concerns regarding your invoice.   Our billing staff will not be able to assist you with questions regarding bills from these companies.  You will be contacted with the lab results as soon as they are available. The fastest way to get your results is to activate your My Chart account. Instructions are located on the last page of this paperwork. If you have not heard from us regarding the results in 2 weeks, please contact this office.

## 2016-10-06 DIAGNOSIS — L400 Psoriasis vulgaris: Secondary | ICD-10-CM | POA: Diagnosis not present

## 2016-10-08 DIAGNOSIS — L400 Psoriasis vulgaris: Secondary | ICD-10-CM | POA: Diagnosis not present

## 2016-10-12 DIAGNOSIS — L400 Psoriasis vulgaris: Secondary | ICD-10-CM | POA: Diagnosis not present

## 2016-10-14 ENCOUNTER — Other Ambulatory Visit: Payer: Self-pay | Admitting: Physician Assistant

## 2016-10-14 DIAGNOSIS — L400 Psoriasis vulgaris: Secondary | ICD-10-CM | POA: Diagnosis not present

## 2016-10-14 DIAGNOSIS — R7309 Other abnormal glucose: Secondary | ICD-10-CM

## 2016-10-16 DIAGNOSIS — L400 Psoriasis vulgaris: Secondary | ICD-10-CM | POA: Diagnosis not present

## 2016-10-19 DIAGNOSIS — L400 Psoriasis vulgaris: Secondary | ICD-10-CM | POA: Diagnosis not present

## 2016-10-21 DIAGNOSIS — L400 Psoriasis vulgaris: Secondary | ICD-10-CM | POA: Diagnosis not present

## 2016-10-22 ENCOUNTER — Ambulatory Visit: Payer: BLUE CROSS/BLUE SHIELD | Admitting: Podiatry

## 2016-10-23 DIAGNOSIS — L400 Psoriasis vulgaris: Secondary | ICD-10-CM | POA: Diagnosis not present

## 2016-10-27 ENCOUNTER — Encounter: Payer: Self-pay | Admitting: Podiatry

## 2016-10-27 ENCOUNTER — Ambulatory Visit (INDEPENDENT_AMBULATORY_CARE_PROVIDER_SITE_OTHER): Payer: BLUE CROSS/BLUE SHIELD | Admitting: Podiatry

## 2016-10-27 DIAGNOSIS — L400 Psoriasis vulgaris: Secondary | ICD-10-CM | POA: Diagnosis not present

## 2016-10-27 DIAGNOSIS — M79676 Pain in unspecified toe(s): Secondary | ICD-10-CM | POA: Diagnosis not present

## 2016-10-27 DIAGNOSIS — B351 Tinea unguium: Secondary | ICD-10-CM | POA: Diagnosis not present

## 2016-10-27 NOTE — Progress Notes (Signed)
He presents today to complaint of painful elongated toenails.  Objective: Toenails are thick yellow dystrophic mycotic pulses remain palpable bilateral.  Assessment: Pain limb segment onychomycosis 1 through 5 bilateral.  Plan: Debridement of toenails 1 through 5 bilateral.

## 2016-10-29 DIAGNOSIS — L400 Psoriasis vulgaris: Secondary | ICD-10-CM | POA: Diagnosis not present

## 2016-11-02 DIAGNOSIS — L400 Psoriasis vulgaris: Secondary | ICD-10-CM | POA: Diagnosis not present

## 2016-11-04 DIAGNOSIS — L400 Psoriasis vulgaris: Secondary | ICD-10-CM | POA: Diagnosis not present

## 2016-11-09 DIAGNOSIS — L400 Psoriasis vulgaris: Secondary | ICD-10-CM | POA: Diagnosis not present

## 2016-11-11 DIAGNOSIS — L400 Psoriasis vulgaris: Secondary | ICD-10-CM | POA: Diagnosis not present

## 2016-11-13 DIAGNOSIS — L400 Psoriasis vulgaris: Secondary | ICD-10-CM | POA: Diagnosis not present

## 2016-11-17 DIAGNOSIS — L400 Psoriasis vulgaris: Secondary | ICD-10-CM | POA: Diagnosis not present

## 2016-11-19 DIAGNOSIS — L400 Psoriasis vulgaris: Secondary | ICD-10-CM | POA: Diagnosis not present

## 2016-11-23 DIAGNOSIS — L400 Psoriasis vulgaris: Secondary | ICD-10-CM | POA: Diagnosis not present

## 2016-11-25 DIAGNOSIS — L400 Psoriasis vulgaris: Secondary | ICD-10-CM | POA: Diagnosis not present

## 2016-11-27 DIAGNOSIS — L400 Psoriasis vulgaris: Secondary | ICD-10-CM | POA: Diagnosis not present

## 2016-12-01 DIAGNOSIS — L400 Psoriasis vulgaris: Secondary | ICD-10-CM | POA: Diagnosis not present

## 2016-12-02 ENCOUNTER — Other Ambulatory Visit: Payer: Self-pay | Admitting: Internal Medicine

## 2016-12-03 DIAGNOSIS — L400 Psoriasis vulgaris: Secondary | ICD-10-CM | POA: Diagnosis not present

## 2016-12-08 DIAGNOSIS — L400 Psoriasis vulgaris: Secondary | ICD-10-CM | POA: Diagnosis not present

## 2016-12-10 DIAGNOSIS — L400 Psoriasis vulgaris: Secondary | ICD-10-CM | POA: Diagnosis not present

## 2016-12-10 DIAGNOSIS — I10 Essential (primary) hypertension: Secondary | ICD-10-CM | POA: Diagnosis not present

## 2016-12-10 DIAGNOSIS — E669 Obesity, unspecified: Secondary | ICD-10-CM | POA: Diagnosis not present

## 2016-12-10 DIAGNOSIS — N183 Chronic kidney disease, stage 3 (moderate): Secondary | ICD-10-CM | POA: Diagnosis not present

## 2016-12-10 DIAGNOSIS — E1122 Type 2 diabetes mellitus with diabetic chronic kidney disease: Secondary | ICD-10-CM | POA: Diagnosis not present

## 2016-12-10 DIAGNOSIS — E782 Mixed hyperlipidemia: Secondary | ICD-10-CM | POA: Diagnosis not present

## 2016-12-28 ENCOUNTER — Ambulatory Visit: Payer: BLUE CROSS/BLUE SHIELD | Admitting: Internal Medicine

## 2017-01-14 ENCOUNTER — Encounter: Payer: BLUE CROSS/BLUE SHIELD | Attending: Family Medicine | Admitting: Dietician

## 2017-01-14 ENCOUNTER — Encounter: Payer: Self-pay | Admitting: Dietician

## 2017-01-14 DIAGNOSIS — Z713 Dietary counseling and surveillance: Secondary | ICD-10-CM | POA: Insufficient documentation

## 2017-01-14 DIAGNOSIS — E119 Type 2 diabetes mellitus without complications: Secondary | ICD-10-CM | POA: Diagnosis not present

## 2017-01-14 DIAGNOSIS — E118 Type 2 diabetes mellitus with unspecified complications: Secondary | ICD-10-CM

## 2017-01-14 NOTE — Progress Notes (Signed)
Patient was seen on 01/14/17 for the first of a series of three diabetes self-management courses at the [facility].  Patient Education Plan per assessed needs and concerns is to attend four course education program for Diabetes Self Management Education.  The following learning objectives were met by the patient during this class:  Describe diabetes  State some common risk factors for diabetes  Defines the role of glucose and insulin  Identifies type of diabetes and pathophysiology  Describe the relationship between diabetes and cardiovascular risk  State the members of the Healthcare Team  States the rationale for glucose monitoring  State when to test glucose  State their individual Target Range  State the importance of logging glucose readings  Describe how to interpret glucose readings  Identifies A1C target  Explain the correlation between A1c and eAG values  State symptoms and treatment of high blood glucose  State symptoms and treatment of low blood glucose  Explain proper technique for glucose testing  Identifies proper sharps disposal  Handouts given during class include:  Living Well with Diabetes book  Carb Counting and Meal Planning book  Meal Plan Card  Carbohydrate guide  Meal planning worksheet  Low Sodium Flavoring Tips  The diabetes portion plate  Q7V to eAG Conversion Chart  Diabetes Medications  Diabetes Recommended Care Schedule  Support Group  Diabetes Success Plan  Core Class Satisfaction Survey  Follow-Up Plan:  Attend core 2

## 2017-01-18 ENCOUNTER — Ambulatory Visit: Payer: BLUE CROSS/BLUE SHIELD | Admitting: Internal Medicine

## 2017-01-21 ENCOUNTER — Encounter: Payer: BLUE CROSS/BLUE SHIELD | Admitting: Dietician

## 2017-01-21 DIAGNOSIS — E119 Type 2 diabetes mellitus without complications: Secondary | ICD-10-CM | POA: Diagnosis not present

## 2017-01-21 DIAGNOSIS — E118 Type 2 diabetes mellitus with unspecified complications: Secondary | ICD-10-CM

## 2017-01-21 DIAGNOSIS — Z713 Dietary counseling and surveillance: Secondary | ICD-10-CM | POA: Diagnosis not present

## 2017-01-21 NOTE — Progress Notes (Signed)

## 2017-01-26 ENCOUNTER — Ambulatory Visit (INDEPENDENT_AMBULATORY_CARE_PROVIDER_SITE_OTHER): Payer: BLUE CROSS/BLUE SHIELD | Admitting: Podiatry

## 2017-01-26 ENCOUNTER — Encounter: Payer: Self-pay | Admitting: Podiatry

## 2017-01-26 DIAGNOSIS — M79676 Pain in unspecified toe(s): Secondary | ICD-10-CM

## 2017-01-26 DIAGNOSIS — E118 Type 2 diabetes mellitus with unspecified complications: Secondary | ICD-10-CM

## 2017-01-26 DIAGNOSIS — B351 Tinea unguium: Secondary | ICD-10-CM | POA: Diagnosis not present

## 2017-01-26 NOTE — Progress Notes (Signed)
States that he is recently been diagnosed with diabetes but presents today with a chief complaint of painful elongated toenails.  Objective: Pulses are palpable. Toenails are long thick yellow dystrophic with mycotic hallux nails bilaterally.  Assessment: Pain limb secondary to onychomycosis.  Plan: Debridement of toenails 1 through 5 bilateral.

## 2017-01-28 DIAGNOSIS — Z713 Dietary counseling and surveillance: Secondary | ICD-10-CM | POA: Diagnosis not present

## 2017-01-28 DIAGNOSIS — E118 Type 2 diabetes mellitus with unspecified complications: Secondary | ICD-10-CM

## 2017-01-28 DIAGNOSIS — E119 Type 2 diabetes mellitus without complications: Secondary | ICD-10-CM | POA: Diagnosis not present

## 2017-01-28 NOTE — Progress Notes (Signed)
Patient was seen on 01/28/17 for the third of a series of three diabetes self-management courses at the [facility].   Janene Madeira. State the amount of activity recommended for healthy living . Describe activities suitable for individual needs . Identify ways to regularly incorporate activity into daily life . Identify barriers to activity and ways to over come these barriers  Identify diabetes medications being personally used and their primary action for lowering glucose and possible side effects . Describe role of stress on blood glucose and develop strategies to address psychosocial issues . Identify diabetes complications and ways to prevent them  Explain how to manage diabetes during illness . Evaluate success in meeting personal goal . Establish 2-3 goals that they will plan to diligently work on until they return for the  1763-month follow-up visit  Goals:   I will count my carb choices at most meals and snacks  I will take my diabetes medications as scheduled  I will test my glucose at least 1 times a day, 7 days a week  Your patient has identified these potential barriers to change:  Stress Lack of Family Support  Your patient has identified their diabetes self-care support plan as  On-line Resources Plan:  Attend Support Group as desired

## 2017-02-11 ENCOUNTER — Telehealth: Payer: Self-pay | Admitting: Internal Medicine

## 2017-02-11 NOTE — Telephone Encounter (Signed)
lmtcb x1 for pt. This medication was refilled on 12/03/16 with 5 additional refills. His pharmacy should have these on file.

## 2017-02-11 NOTE — Telephone Encounter (Signed)
Patient returned call.  He was advised that there should be refills at his pharmacy.  He states he will call them for this.  No call back is needed.

## 2017-02-11 NOTE — Telephone Encounter (Signed)
lmomtcb x1 

## 2017-02-11 NOTE — Telephone Encounter (Signed)
Pt returning call.Dennis Sims ° °

## 2017-03-15 ENCOUNTER — Telehealth: Payer: Self-pay | Admitting: Internal Medicine

## 2017-03-15 MED ORDER — BISOPROLOL FUMARATE 10 MG PO TABS: 10.0000 mg | ORAL_TABLET | Freq: Every day | ORAL | 5 refills | Status: DC

## 2017-03-15 NOTE — Telephone Encounter (Signed)
Pt requesting a refill on bisoprolol.  This has been sent to preferred pharmacy.  Nothing further needed.

## 2017-03-16 NOTE — Progress Notes (Signed)
PFT done.  

## 2017-03-22 DIAGNOSIS — N183 Chronic kidney disease, stage 3 (moderate): Secondary | ICD-10-CM | POA: Diagnosis not present

## 2017-03-22 DIAGNOSIS — L659 Nonscarring hair loss, unspecified: Secondary | ICD-10-CM | POA: Diagnosis not present

## 2017-03-22 DIAGNOSIS — I10 Essential (primary) hypertension: Secondary | ICD-10-CM | POA: Diagnosis not present

## 2017-03-22 DIAGNOSIS — Z1159 Encounter for screening for other viral diseases: Secondary | ICD-10-CM | POA: Diagnosis not present

## 2017-03-22 DIAGNOSIS — Z23 Encounter for immunization: Secondary | ICD-10-CM | POA: Diagnosis not present

## 2017-03-22 DIAGNOSIS — E782 Mixed hyperlipidemia: Secondary | ICD-10-CM | POA: Diagnosis not present

## 2017-03-22 DIAGNOSIS — E1122 Type 2 diabetes mellitus with diabetic chronic kidney disease: Secondary | ICD-10-CM | POA: Diagnosis not present

## 2017-03-22 DIAGNOSIS — Z125 Encounter for screening for malignant neoplasm of prostate: Secondary | ICD-10-CM | POA: Diagnosis not present

## 2017-03-23 ENCOUNTER — Other Ambulatory Visit: Payer: Self-pay | Admitting: Internal Medicine

## 2017-03-24 DIAGNOSIS — Z202 Contact with and (suspected) exposure to infections with a predominantly sexual mode of transmission: Secondary | ICD-10-CM | POA: Diagnosis not present

## 2017-04-29 ENCOUNTER — Ambulatory Visit (INDEPENDENT_AMBULATORY_CARE_PROVIDER_SITE_OTHER): Payer: BLUE CROSS/BLUE SHIELD | Admitting: Podiatry

## 2017-04-29 ENCOUNTER — Encounter: Payer: Self-pay | Admitting: Podiatry

## 2017-04-29 DIAGNOSIS — B351 Tinea unguium: Secondary | ICD-10-CM

## 2017-04-29 DIAGNOSIS — M79676 Pain in unspecified toe(s): Secondary | ICD-10-CM | POA: Diagnosis not present

## 2017-05-01 NOTE — Progress Notes (Signed)
Presents today to complaint of painful elongated toenails.  Objective: Vital signs are stable and oriented 3. Pulses are palpable. Neurologic sensorium is intact. Toenails are thick yellow dystrophic onychomycotic.  Assessment: Pain in limb secondary to onychomycosis.  Plan: Debridement toenails bilateral.

## 2017-07-02 ENCOUNTER — Other Ambulatory Visit: Payer: Self-pay | Admitting: Internal Medicine

## 2018-01-10 DIAGNOSIS — I10 Essential (primary) hypertension: Secondary | ICD-10-CM | POA: Diagnosis not present

## 2018-01-10 DIAGNOSIS — E782 Mixed hyperlipidemia: Secondary | ICD-10-CM | POA: Diagnosis not present

## 2018-01-10 DIAGNOSIS — E119 Type 2 diabetes mellitus without complications: Secondary | ICD-10-CM | POA: Diagnosis not present

## 2018-01-10 DIAGNOSIS — E559 Vitamin D deficiency, unspecified: Secondary | ICD-10-CM | POA: Diagnosis not present

## 2018-01-10 DIAGNOSIS — Z202 Contact with and (suspected) exposure to infections with a predominantly sexual mode of transmission: Secondary | ICD-10-CM | POA: Diagnosis not present

## 2018-01-11 DIAGNOSIS — B372 Candidiasis of skin and nail: Secondary | ICD-10-CM | POA: Diagnosis not present

## 2018-01-11 DIAGNOSIS — L400 Psoriasis vulgaris: Secondary | ICD-10-CM | POA: Diagnosis not present

## 2018-01-12 DIAGNOSIS — E1122 Type 2 diabetes mellitus with diabetic chronic kidney disease: Secondary | ICD-10-CM | POA: Diagnosis not present

## 2018-01-12 DIAGNOSIS — E669 Obesity, unspecified: Secondary | ICD-10-CM | POA: Diagnosis not present

## 2018-01-12 DIAGNOSIS — Z23 Encounter for immunization: Secondary | ICD-10-CM | POA: Diagnosis not present

## 2018-01-12 DIAGNOSIS — E782 Mixed hyperlipidemia: Secondary | ICD-10-CM | POA: Diagnosis not present

## 2018-02-18 ENCOUNTER — Telehealth: Payer: Self-pay | Admitting: Internal Medicine

## 2018-02-18 NOTE — Telephone Encounter (Signed)
Pt last seen at our office 09/22/16.  Called pt letting him know since he has not been seen since 2017, we were unable to refill med for him and he could call his PCP to see if they would refill med for him, otherwise we would have to have him come in for an appt.  Pt expressed understanding and stated he would call his PCP.  Nothing further needed at this current time.

## 2018-03-02 IMAGING — DX DG CHEST 2V
3 series · 3 of 3 positions shown · non-contrast
Comparison: None.

CLINICAL DATA: Cough and minimal wheezing

EXAM:
CHEST  2 VIEW

[chest pa (1 of 2)]
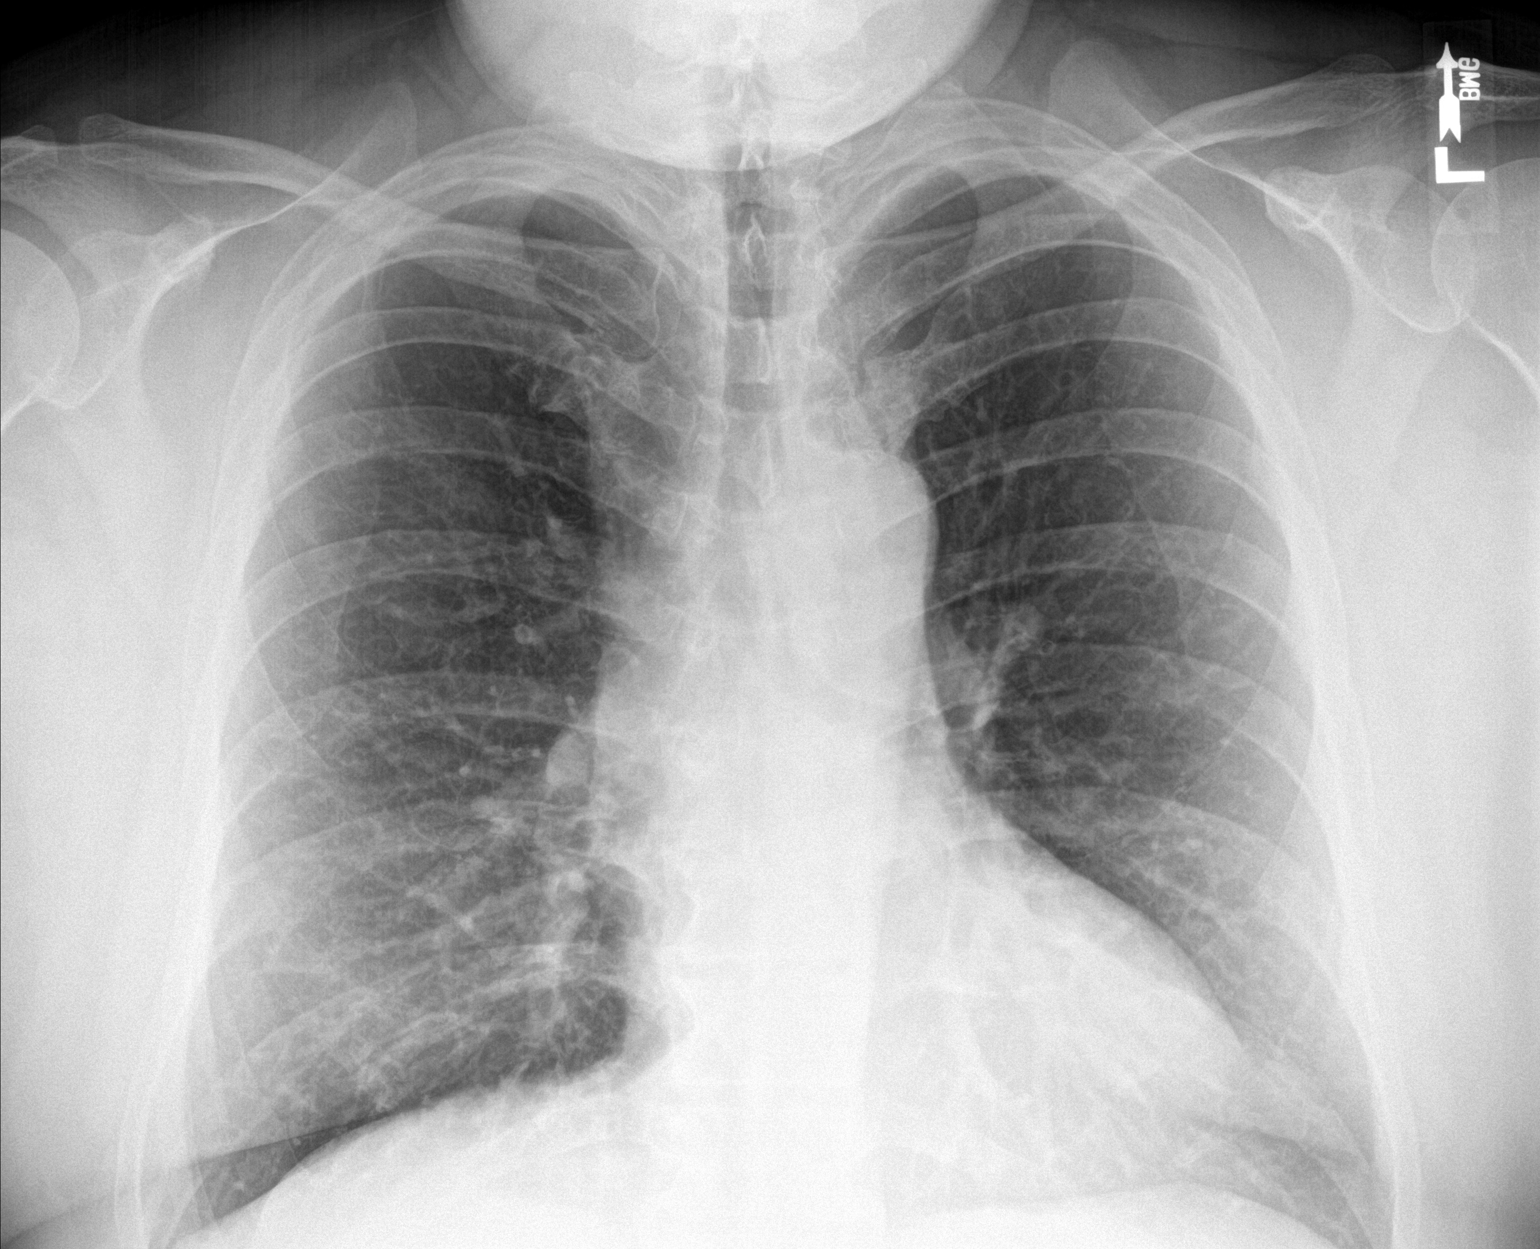

[chest lat]
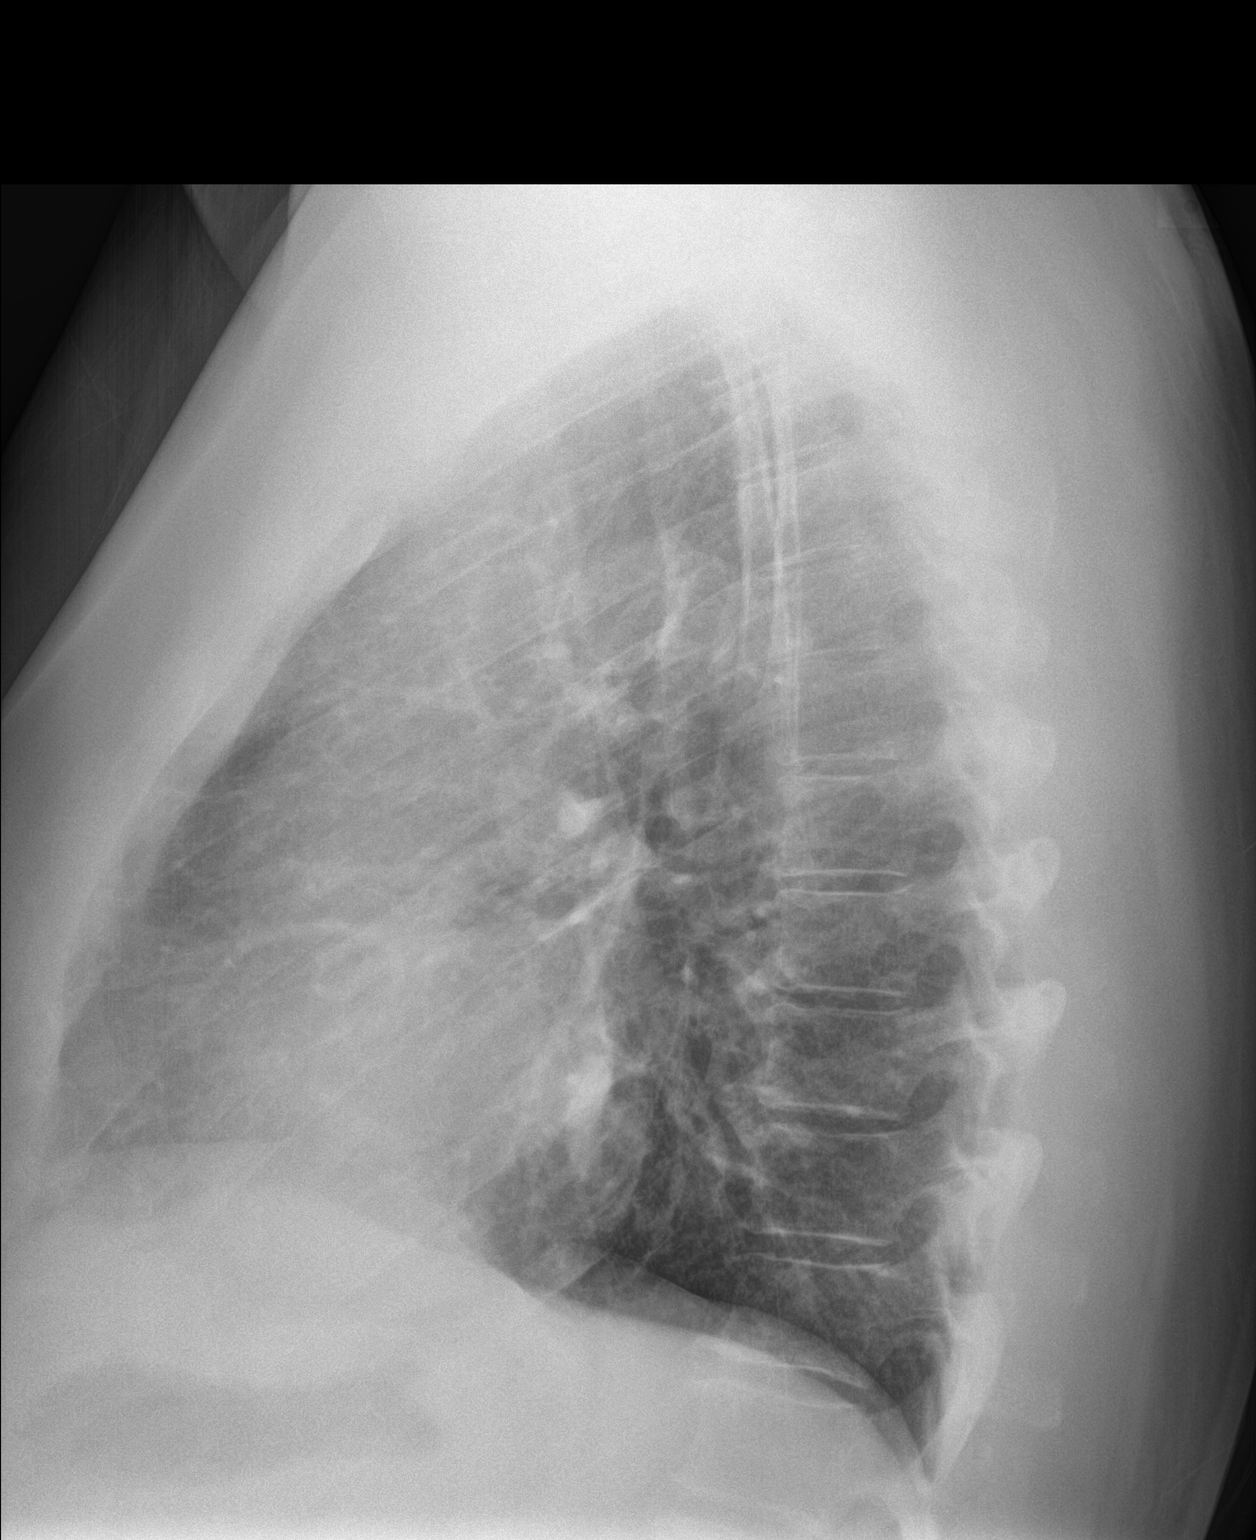

[chest pa (2 of 2)]
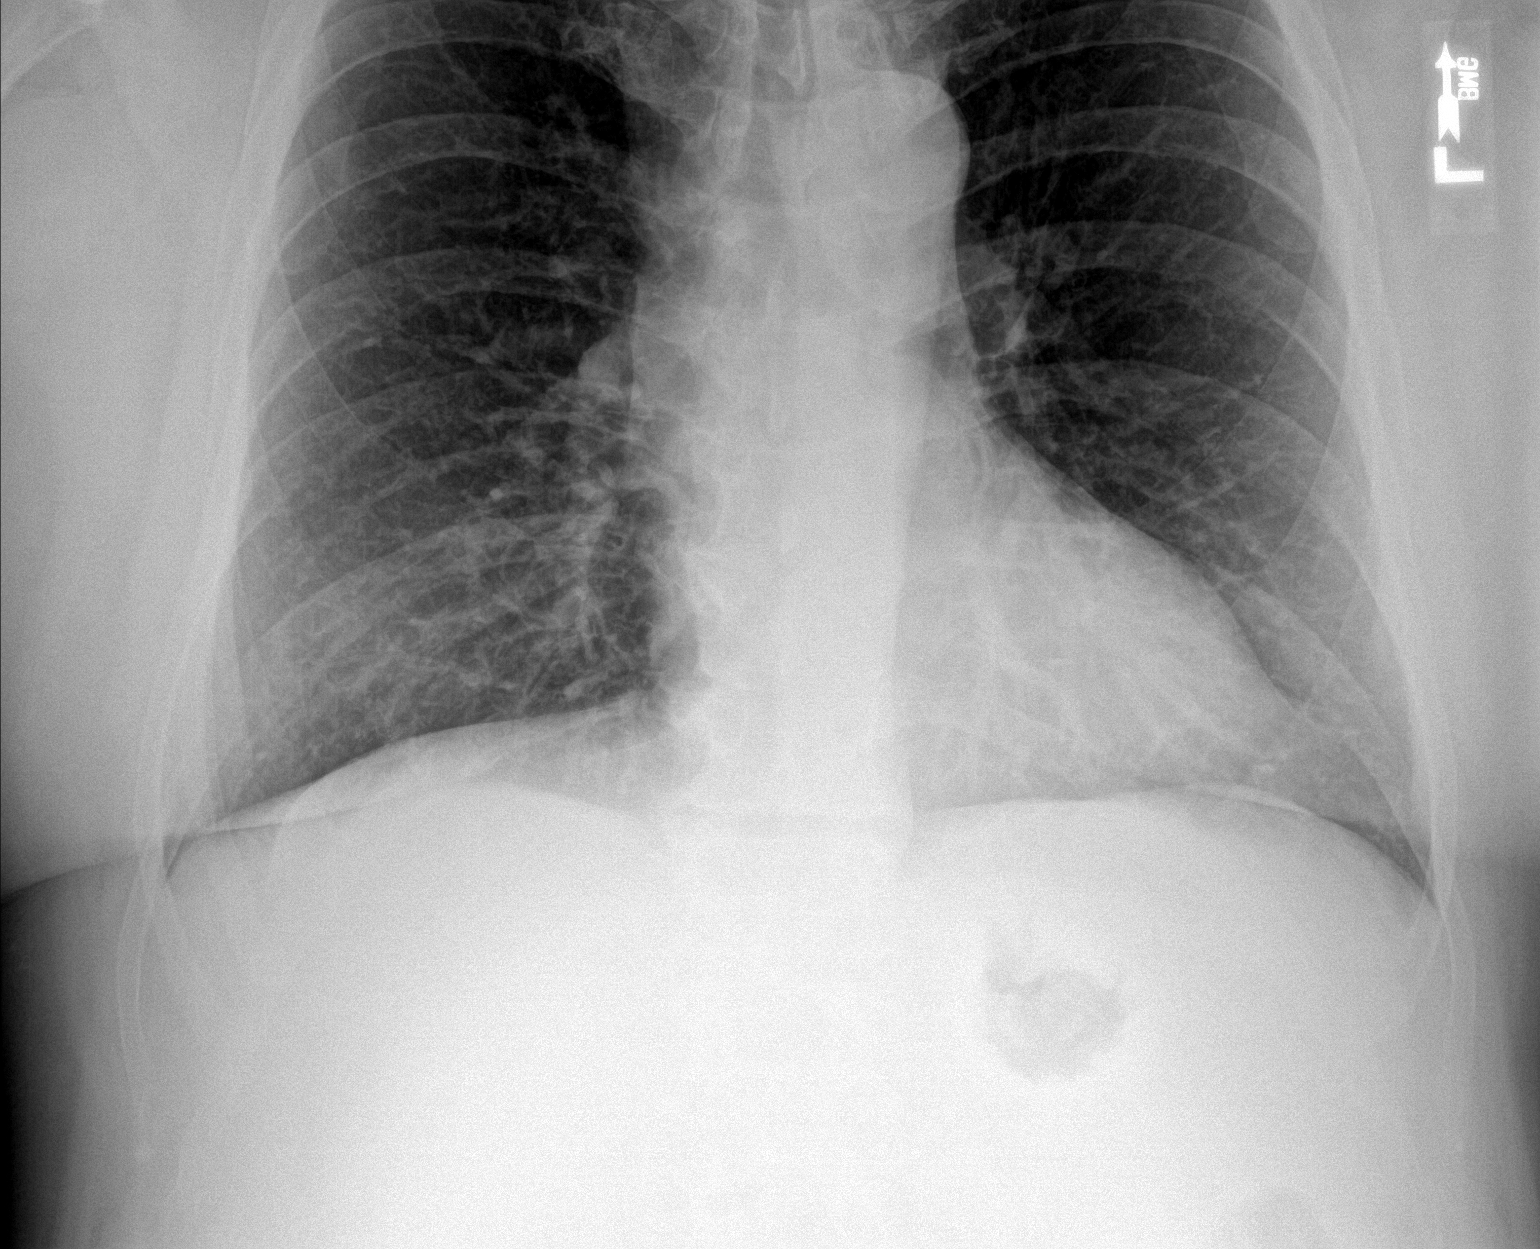

[3 of 3 positions shown; findings below may reference images not displayed]

FINDINGS: The heart size and mediastinal contours are within normal limits.
Both lungs are clear. The visualized skeletal structures are
unremarkable.
IMPRESSION: No active cardiopulmonary disease.

## 2018-05-28 DIAGNOSIS — M109 Gout, unspecified: Secondary | ICD-10-CM | POA: Diagnosis not present

## 2018-05-28 DIAGNOSIS — M79674 Pain in right toe(s): Secondary | ICD-10-CM | POA: Diagnosis not present

## 2018-07-02 DIAGNOSIS — M109 Gout, unspecified: Secondary | ICD-10-CM | POA: Diagnosis not present

## 2018-08-30 DIAGNOSIS — E1122 Type 2 diabetes mellitus with diabetic chronic kidney disease: Secondary | ICD-10-CM | POA: Diagnosis not present

## 2018-08-31 DIAGNOSIS — E1122 Type 2 diabetes mellitus with diabetic chronic kidney disease: Secondary | ICD-10-CM | POA: Diagnosis not present

## 2018-08-31 DIAGNOSIS — Z23 Encounter for immunization: Secondary | ICD-10-CM | POA: Diagnosis not present

## 2018-08-31 DIAGNOSIS — I10 Essential (primary) hypertension: Secondary | ICD-10-CM | POA: Diagnosis not present

## 2018-10-12 DIAGNOSIS — M109 Gout, unspecified: Secondary | ICD-10-CM | POA: Diagnosis not present

## 2019-06-08 ENCOUNTER — Telehealth: Payer: Self-pay

## 2019-06-08 NOTE — Telephone Encounter (Signed)
Patient called wanting to discuss the precautions taken in office for COVID-19. He has an appointment scheduled for Tuesday (06/13/2019). Left call back number [phone].

## 2019-06-08 NOTE — Telephone Encounter (Signed)
I called patient and let him know what extra precautions we were taking. He felt comfortable with the information. Confirmed his appt for Tuesday morning.

## 2019-06-13 ENCOUNTER — Ambulatory Visit: Payer: BLUE CROSS/BLUE SHIELD | Admitting: Podiatry

## 2019-06-13 ENCOUNTER — Other Ambulatory Visit: Payer: Self-pay

## 2019-06-13 ENCOUNTER — Encounter: Payer: Self-pay | Admitting: Podiatry

## 2019-06-13 VITALS — Temp 97.3°F

## 2019-06-13 DIAGNOSIS — M79676 Pain in unspecified toe(s): Secondary | ICD-10-CM

## 2019-06-13 DIAGNOSIS — B351 Tinea unguium: Secondary | ICD-10-CM | POA: Diagnosis not present

## 2019-06-13 DIAGNOSIS — E1142 Type 2 diabetes mellitus with diabetic polyneuropathy: Secondary | ICD-10-CM | POA: Diagnosis not present

## 2019-06-13 NOTE — Progress Notes (Signed)
He presents today with a chief complaint of painful elongated toenails.  Objective: Toenails are long thick yellow dystrophic onychomycotic pulses are palpable no open lesions or wounds are noted.  Assessment: Pain in limb secondary to onychomycosis and nail dystrophy.  Plan: Debridement of toenails 1 through 5 bilateral.  He will follow-up as needed.

## 2019-09-15 DIAGNOSIS — R350 Frequency of micturition: Secondary | ICD-10-CM | POA: Diagnosis not present

## 2020-01-03 DIAGNOSIS — E119 Type 2 diabetes mellitus without complications: Secondary | ICD-10-CM | POA: Diagnosis not present

## 2020-01-03 DIAGNOSIS — M109 Gout, unspecified: Secondary | ICD-10-CM | POA: Diagnosis not present

## 2020-01-08 DIAGNOSIS — Z87891 Personal history of nicotine dependence: Secondary | ICD-10-CM | POA: Diagnosis not present

## 2020-01-08 DIAGNOSIS — M109 Gout, unspecified: Secondary | ICD-10-CM | POA: Diagnosis not present

## 2020-01-08 DIAGNOSIS — E1122 Type 2 diabetes mellitus with diabetic chronic kidney disease: Secondary | ICD-10-CM | POA: Diagnosis not present

## 2020-01-08 DIAGNOSIS — N183 Chronic kidney disease, stage 3 unspecified: Secondary | ICD-10-CM | POA: Diagnosis not present

## 2020-04-15 DIAGNOSIS — E1122 Type 2 diabetes mellitus with diabetic chronic kidney disease: Secondary | ICD-10-CM | POA: Diagnosis not present

## 2020-04-15 DIAGNOSIS — E782 Mixed hyperlipidemia: Secondary | ICD-10-CM | POA: Diagnosis not present

## 2020-04-19 DIAGNOSIS — G5702 Lesion of sciatic nerve, left lower limb: Secondary | ICD-10-CM | POA: Diagnosis not present

## 2020-04-19 DIAGNOSIS — E1122 Type 2 diabetes mellitus with diabetic chronic kidney disease: Secondary | ICD-10-CM | POA: Diagnosis not present

## 2020-04-19 DIAGNOSIS — N183 Chronic kidney disease, stage 3 unspecified: Secondary | ICD-10-CM | POA: Diagnosis not present

## 2020-06-28 DIAGNOSIS — B078 Other viral warts: Secondary | ICD-10-CM | POA: Diagnosis not present

## 2020-06-28 DIAGNOSIS — L400 Psoriasis vulgaris: Secondary | ICD-10-CM | POA: Diagnosis not present

## 2020-07-05 DIAGNOSIS — E1122 Type 2 diabetes mellitus with diabetic chronic kidney disease: Secondary | ICD-10-CM | POA: Diagnosis not present

## 2020-07-05 DIAGNOSIS — R42 Dizziness and giddiness: Secondary | ICD-10-CM | POA: Diagnosis not present

## 2020-07-16 ENCOUNTER — Encounter: Payer: Self-pay | Admitting: Podiatry

## 2020-07-16 ENCOUNTER — Other Ambulatory Visit: Payer: Self-pay

## 2020-07-16 ENCOUNTER — Ambulatory Visit (INDEPENDENT_AMBULATORY_CARE_PROVIDER_SITE_OTHER): Payer: BC Managed Care – PPO | Admitting: Podiatry

## 2020-07-16 DIAGNOSIS — M79676 Pain in unspecified toe(s): Secondary | ICD-10-CM | POA: Diagnosis not present

## 2020-07-16 DIAGNOSIS — B351 Tinea unguium: Secondary | ICD-10-CM | POA: Diagnosis not present

## 2020-07-16 DIAGNOSIS — E1142 Type 2 diabetes mellitus with diabetic polyneuropathy: Secondary | ICD-10-CM | POA: Diagnosis not present

## 2020-07-16 NOTE — Progress Notes (Signed)
He presents today chief complaint of painful elongated toenails.  Objective: Pulses are palpable.  Toenails are long thick yellow dystrophic-like mycotic painful palpation.  Assessment: Pain in limb secondary to onychomycosis.  Plan: Debridement tenderness 1 through 5 bilateral cover service.

## 2020-07-25 DIAGNOSIS — E1122 Type 2 diabetes mellitus with diabetic chronic kidney disease: Secondary | ICD-10-CM | POA: Diagnosis not present

## 2020-08-05 DIAGNOSIS — N183 Chronic kidney disease, stage 3 unspecified: Secondary | ICD-10-CM | POA: Diagnosis not present

## 2020-08-05 DIAGNOSIS — Z6838 Body mass index (BMI) 38.0-38.9, adult: Secondary | ICD-10-CM | POA: Diagnosis not present

## 2020-08-05 DIAGNOSIS — E1122 Type 2 diabetes mellitus with diabetic chronic kidney disease: Secondary | ICD-10-CM | POA: Diagnosis not present

## 2020-11-11 DIAGNOSIS — E1122 Type 2 diabetes mellitus with diabetic chronic kidney disease: Secondary | ICD-10-CM | POA: Diagnosis not present

## 2020-11-15 DIAGNOSIS — N183 Chronic kidney disease, stage 3 unspecified: Secondary | ICD-10-CM | POA: Diagnosis not present

## 2020-11-15 DIAGNOSIS — E1122 Type 2 diabetes mellitus with diabetic chronic kidney disease: Secondary | ICD-10-CM | POA: Diagnosis not present

## 2020-11-15 DIAGNOSIS — I10 Essential (primary) hypertension: Secondary | ICD-10-CM | POA: Diagnosis not present

## 2021-01-16 DIAGNOSIS — L820 Inflamed seborrheic keratosis: Secondary | ICD-10-CM | POA: Diagnosis not present

## 2021-01-16 DIAGNOSIS — B078 Other viral warts: Secondary | ICD-10-CM | POA: Diagnosis not present

## 2021-01-16 DIAGNOSIS — L400 Psoriasis vulgaris: Secondary | ICD-10-CM | POA: Diagnosis not present

## 2021-01-16 DIAGNOSIS — B070 Plantar wart: Secondary | ICD-10-CM | POA: Diagnosis not present

## 2021-06-02 DIAGNOSIS — E782 Mixed hyperlipidemia: Secondary | ICD-10-CM | POA: Diagnosis not present

## 2021-06-02 DIAGNOSIS — E1122 Type 2 diabetes mellitus with diabetic chronic kidney disease: Secondary | ICD-10-CM | POA: Diagnosis not present

## 2021-06-12 ENCOUNTER — Other Ambulatory Visit: Payer: Self-pay

## 2021-06-12 ENCOUNTER — Encounter: Payer: Self-pay | Admitting: Podiatry

## 2021-06-12 ENCOUNTER — Ambulatory Visit (INDEPENDENT_AMBULATORY_CARE_PROVIDER_SITE_OTHER): Payer: BC Managed Care – PPO | Admitting: Podiatry

## 2021-06-12 DIAGNOSIS — E1142 Type 2 diabetes mellitus with diabetic polyneuropathy: Secondary | ICD-10-CM | POA: Diagnosis not present

## 2021-06-12 DIAGNOSIS — B351 Tinea unguium: Secondary | ICD-10-CM | POA: Diagnosis not present

## 2021-06-12 DIAGNOSIS — M79676 Pain in unspecified toe(s): Secondary | ICD-10-CM

## 2021-06-12 NOTE — Progress Notes (Signed)
He presents today chief complaint of painful elongated toenails.  Objective: Toenails are long thick yellow dystrophic-like mycotic painful palpation.  Pulses remain strong and palpable.  No open lesions or wounds are noted.  Assessment: Pain in limb secondary to onychomycosis.  Plan: Debridement of toenails 1 through 5 bilateral.

## 2021-06-23 DIAGNOSIS — M62830 Muscle spasm of back: Secondary | ICD-10-CM | POA: Diagnosis not present

## 2021-07-03 DIAGNOSIS — M5459 Other low back pain: Secondary | ICD-10-CM | POA: Diagnosis not present

## 2021-07-03 DIAGNOSIS — S39012A Strain of muscle, fascia and tendon of lower back, initial encounter: Secondary | ICD-10-CM | POA: Diagnosis not present

## 2021-07-23 DIAGNOSIS — L400 Psoriasis vulgaris: Secondary | ICD-10-CM | POA: Diagnosis not present

## 2021-07-24 DIAGNOSIS — M546 Pain in thoracic spine: Secondary | ICD-10-CM | POA: Diagnosis not present

## 2021-08-04 DIAGNOSIS — M545 Low back pain, unspecified: Secondary | ICD-10-CM | POA: Diagnosis not present

## 2021-08-27 DIAGNOSIS — I10 Essential (primary) hypertension: Secondary | ICD-10-CM | POA: Diagnosis not present

## 2021-08-27 DIAGNOSIS — M545 Low back pain, unspecified: Secondary | ICD-10-CM | POA: Diagnosis not present

## 2021-08-27 DIAGNOSIS — N183 Chronic kidney disease, stage 3 unspecified: Secondary | ICD-10-CM | POA: Diagnosis not present

## 2021-08-27 DIAGNOSIS — Z23 Encounter for immunization: Secondary | ICD-10-CM | POA: Diagnosis not present

## 2021-08-27 DIAGNOSIS — E1122 Type 2 diabetes mellitus with diabetic chronic kidney disease: Secondary | ICD-10-CM | POA: Diagnosis not present

## 2021-08-31 DIAGNOSIS — E119 Type 2 diabetes mellitus without complications: Secondary | ICD-10-CM | POA: Diagnosis not present

## 2021-08-31 DIAGNOSIS — L02214 Cutaneous abscess of groin: Secondary | ICD-10-CM | POA: Diagnosis not present

## 2021-08-31 DIAGNOSIS — E669 Obesity, unspecified: Secondary | ICD-10-CM | POA: Diagnosis not present

## 2021-09-01 ENCOUNTER — Ambulatory Visit (INDEPENDENT_AMBULATORY_CARE_PROVIDER_SITE_OTHER): Payer: BC Managed Care – PPO | Admitting: Endocrinology

## 2021-09-01 ENCOUNTER — Encounter: Payer: Self-pay | Admitting: Endocrinology

## 2021-09-01 ENCOUNTER — Other Ambulatory Visit: Payer: Self-pay

## 2021-09-01 VITALS — BP 140/70 | HR 65 | Ht 75.5 in | Wt 303.0 lb

## 2021-09-01 DIAGNOSIS — E119 Type 2 diabetes mellitus without complications: Secondary | ICD-10-CM | POA: Diagnosis not present

## 2021-09-01 DIAGNOSIS — L409 Psoriasis, unspecified: Secondary | ICD-10-CM | POA: Diagnosis not present

## 2021-09-01 DIAGNOSIS — Z794 Long term (current) use of insulin: Secondary | ICD-10-CM | POA: Diagnosis not present

## 2021-09-01 DIAGNOSIS — N183 Chronic kidney disease, stage 3 unspecified: Secondary | ICD-10-CM | POA: Insufficient documentation

## 2021-09-01 LAB — POCT GLYCOSYLATED HEMOGLOBIN (HGB A1C): Hemoglobin A1C: 13.8 % — AB (ref 4.0–5.6)

## 2021-09-01 MED ORDER — OZEMPIC (0.25 OR 0.5 MG/DOSE) 2 MG/1.5ML ~~LOC~~ SOPN: 0.5000 mg | PEN_INJECTOR | SUBCUTANEOUS | 3 refills | Status: DC

## 2021-09-01 MED ORDER — LANTUS SOLOSTAR 100 UNIT/ML ~~LOC~~ SOPN: 40.0000 [IU] | PEN_INJECTOR | SUBCUTANEOUS | 3 refills | Status: DC

## 2021-09-01 MED ORDER — METFORMIN HCL ER 500 MG PO TB24: 500.0000 mg | ORAL_TABLET | Freq: Every day | ORAL | 3 refills | Status: DC

## 2021-09-01 NOTE — Patient Instructions (Addendum)
good diet and exercise significantly improve the control of your diabetes.  please let me know if you wish to be referred to a dietician.  high blood sugar is very risky to your health.  you should see an eye doctor and dentist every year.  It is very important to get all recommended vaccinations.  Controlling your blood pressure and cholesterol drastically reduces the damage diabetes does to your body.  Those who smoke should quit.  Please discuss these with your doctor.  check your blood sugar twice a day.  vary the time of day when you check, between before the 3 meals, and at bedtime.  also check if you have symptoms of your blood sugar being too high or too low.  please keep a record of the readings and bring it to your next appointment here (or you can bring the meter itself).  You can write it on any piece of paper.  please call us sooner if your blood sugar goes below 70, or if most of your readings are over 200.   We will need to take this complex situation in stages.    I have sent prescriptions to your pharmacy, to increase the Lantus, and to resume the Ozempic.   Please come back for a follow-up appointment in 2 months.

## 2021-09-01 NOTE — Progress Notes (Signed)
Subjective:    Patient ID: Dennis Sims, male    DOB: Jun 06, 1966, 55 y.o.   MRN: [id number]  HPI pt is referred by Dr Hyacinth Meeker, for diabetes.  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  He seldom checks cbg.  He says when he does check it, it is in the 300's.  He describes himself as noncompliant, but he never misses the insulin.  He stopped Ozempic a few weeks ago.   Past Medical History:  Diagnosis Date   Diabetes mellitus without complication (HCC)    Diverticulitis    Hypertension    Plantar fasciitis    Sleep apnea     Past Surgical History:  Procedure Laterality Date   colonscopy     DENTAL SURGERY     FOOT SURGERY      Social History   Socioeconomic History   Marital status: Married    Spouse name: Not on file   Number of children: Not on file   Years of education: Not on file   Highest education level: Not on file  Occupational History   Not on file  Tobacco Use   Smoking status: Former    Packs/day: 1.50    Years: 25.00    Pack years: 37.50    Types: Cigarettes    Quit date: 08/07/2016    Years since quitting: 5.0   Smokeless tobacco: Never  Substance and Sexual Activity   Alcohol use: Yes    Comment: BEER AND ALCOHOL   Drug use: No   Sexual activity: Not on file  Other Topics Concern   Not on file  Social History Narrative   Own a Patent examiner business.    Social Determinants of Health   Financial Resource Strain: Not on file  Food Insecurity: Not on file  Transportation Needs: Not on file  Physical Activity: Not on file  Stress: Not on file  Social Connections: Not on file  Intimate Partner Violence: Not on file    Current Outpatient Medications on File Prior to Visit  Medication Sig Dispense Refill   atorvastatin (LIPITOR) 10 MG tablet TK 1 T PO QD      B-D UF III MINI PEN NEEDLES 31G X 5 MM MISC USE WITH INJECTION FOR LANTUS     bisoprolol (ZEBETA) 10 MG tablet TAKE 1 TABLET DAILY. 30 tablet 2   CHANTIX CONTINUING MONTH PAK 1 MG tablet TK 1 T PO BID     fluticasone furoate-vilanterol (BREO ELLIPTA) 100-25 MCG/INH AEPB Inhale 1 puff into the lungs daily. 28 each 0   indomethacin (INDOCIN) 50 MG capsule TK 1 C PO TID WF OR MILK FOR 7 DAYS     losartan (COZAAR) 50 MG tablet TK 1 T PO QD     Melatonin 10 MG TABS Take 10 mg by mouth at bedtime.     TACLONEX external suspension Apply 1 application topically daily. Applies to left shin and right ankle for psoriasis  2   tadalafil (CIALIS) 5 MG tablet TK 1 T PO QD PRN     VENTOLIN HFA 108 (90 Base) MCG/ACT inhaler INHALE 2 PUFFES EVERY 4-6 HOURS FOR COUGH OR SHORTNESS OF BREATH. 18 g 0   No current facility-administered medications on file prior to visit.    Allergies  Allergen Reactions   Keflex [Cephalexin] Other (See Comments)    Upset stomach, felt overly tired and felt "crappy"   Oxycodone Rash    ITCHING AND UNABLE TO SLEEP        Family History  Problem Relation Age of Onset   Heart disease Father    Colon cancer Father    Bipolar disorder Brother    Diabetes Neg Hx     BP 140/70 (BP Location: Right Arm, Patient Position: Sitting, Cuff Size: Large)   Pulse 65   Ht 6' 3.5" (1.918 m)   Wt (!) 303 lb (137.4 kg)   SpO2 98%   BMI 37.37 kg/m    Review of Systems denies weight loss, sob, n/v, and depression.      Objective:   Physical Exam Pulses: dorsalis pedis intact bilat.   MSK: no deformity of the feet CV: no leg edema.   Skin:  no ulcer on the feet.  Psoriasis on the legs.  normal color and temp on the feet.  Neuro: sensation is intact to touch on the feet.    Lab Results  Component Value Date   HGBA1C 13.8 (A) 09/01/2021   I have reviewed outside records, and summarized: Pt was noted to have elevated A1c, and referred here.  Obesity, HTN, and dyslipidemia were  also addressed.      Assessment & Plan:  Insulin-requiring type 2 DM: uncontrolled Psoriasis: this, severely high A1c, and neg FHx raise the possibility he is evolving type 1  Patient Instructions  good diet and exercise significantly improve the control of your diabetes.  please let me know if you wish to be referred to a dietician.  high blood sugar is very risky to your health.  you should see an eye doctor and dentist every year.  It is very important to get all recommended vaccinations.  Controlling your blood pressure and cholesterol drastically reduces the damage diabetes does to your body.  Those who smoke should quit.  Please discuss these with your doctor.  check your blood sugar twice a day.  vary the time of day when you check, between before the 3 meals, and at bedtime.  also check if you have symptoms of your blood sugar being too high or too low.  please keep a record of the readings and bring it to your next appointment here (or you can bring the meter itself).  You can write it on any piece of paper.  please call us sooner if your blood sugar goes below 70, or if most of your readings are over 200.   We will need to take this complex situation in stages.    I have sent prescriptions to your pharmacy, to increase the Lantus, and to resume the Ozempic.   Please come back for a follow-up appointment in 2 months.

## 2021-11-03 ENCOUNTER — Ambulatory Visit (INDEPENDENT_AMBULATORY_CARE_PROVIDER_SITE_OTHER): Payer: BC Managed Care – PPO | Admitting: Endocrinology

## 2021-11-03 ENCOUNTER — Other Ambulatory Visit: Payer: Self-pay

## 2021-11-03 VITALS — BP 136/80 | HR 61 | Ht 75.5 in | Wt 301.8 lb

## 2021-11-03 DIAGNOSIS — E119 Type 2 diabetes mellitus without complications: Secondary | ICD-10-CM

## 2021-11-03 LAB — POCT GLYCOSYLATED HEMOGLOBIN (HGB A1C): Hemoglobin A1C: 13 % — AB (ref 4.0–5.6)

## 2021-11-03 MED ORDER — TOUJEO SOLOSTAR 300 UNIT/ML ~~LOC~~ SOPN: 60.0000 [IU] | PEN_INJECTOR | SUBCUTANEOUS | 3 refills | Status: DC

## 2021-11-03 MED ORDER — TRULICITY 4.5 MG/0.5ML ~~LOC~~ SOAJ: 4.5000 mg | SUBCUTANEOUS | 3 refills | Status: DC

## 2021-11-03 NOTE — Progress Notes (Signed)
Subjective:    Patient ID: Dennis Sims, male    DOB: [birth year], 55 y.o.   MRN: [id number]  HPI Pt returns for f/u of diabetes mellitus: DM type: Insulin-requiring type 2 Dx'ed: [id number] Complications: stage 3 CRI Therapy: insulin since dx, metformin, and Ozempic DKA: never Severe hypoglycemia: never Pancreatitis: never Pancreatic imaging: normal in 2014 CT SDOH: he does not check cbg, but he never misses the insulin Other: he is not a candidate for multiple daily injections Interval history: He takes Ozempic, 2x0.5 mg/week.  Pt says he does not miss the Lantus.  He does not check cbg.   Past Medical History:  Diagnosis Date   Diabetes mellitus without complication ([place])    Diverticulitis    Hypertension    Plantar fasciitis    Sleep apnea     Past Surgical History:  Procedure Laterality Date   colonscopy     DENTAL SURGERY     FOOT SURGERY      Social History   Socioeconomic History   Marital status: Married    Spouse name: Not on file   Number of children: Not on file   Years of education: Not on file   Highest education level: Not on file  Occupational History   Not on file  Tobacco Use   Smoking status: Former    Packs/day: 1.50    Years: 25.00    Pack years: 37.50    Types: Cigarettes    Quit date: 08/07/2016    Years since quitting: 5.2   Smokeless tobacco: Never  Substance and Sexual Activity   Alcohol use: Yes    Comment: BEER AND ALCOHOL   Drug use: No   Sexual activity: Not on file  Other Topics Concern   Not on file  Social History Narrative   Own a Marketing executive business.    Social Determinants of Health   Financial Resource Strain: Not on file  Food Insecurity: Not on file  Transportation Needs: Not on file  Physical Activity: Not on file  Stress: Not on file  Social Connections: Not on file  Intimate Partner Violence: Not on file    Current Outpatient Medications on File Prior to Visit  Medication Sig Dispense Refill    atorvastatin (LIPITOR) 10 MG tablet TK 1 T PO QD     B-D UF III MINI PEN NEEDLES 31G X 5 MM MISC USE WITH INJECTION FOR LANTUS     bisoprolol (ZEBETA) 10 MG tablet TAKE 1 TABLET DAILY. 30 tablet 2   CHANTIX CONTINUING MONTH PAK 1 MG tablet TK 1 T PO BID     fluticasone furoate-vilanterol (BREO ELLIPTA) 100-25 MCG/INH AEPB Inhale 1 puff into the lungs daily. 28 each 0   indomethacin (INDOCIN) 50 MG capsule TK 1 C PO TID WF OR MILK FOR 7 DAYS     losartan (COZAAR) 50 MG tablet TK 1 T PO QD     Melatonin 10 MG TABS Take 10 mg by mouth at bedtime.     metFORMIN (GLUCOPHAGE-XR) 500 MG 24 hr tablet Take 1 tablet (500 mg total) by mouth daily with breakfast. 90 tablet 3   TACLONEX external suspension Apply 1 application topically daily. Applies to left shin and right ankle for psoriasis  2   tadalafil (CIALIS) 5 MG tablet TK 1 T PO QD PRN     VENTOLIN HFA 108 (90 Base) MCG/ACT inhaler INHALE 2 PUFFES EVERY 4-6 HOURS FOR COUGH OR SHORTNESS OF BREATH. 18 g 0  No current facility-administered medications on file prior to visit.     Family History  Problem Relation Age of Onset   Heart disease Father    Colon cancer Father    Bipolar disorder Brother    Diabetes Neg Hx     BP 136/80 (BP Location: Right Arm, Patient Position: Sitting, Cuff Size: Large)   Pulse 61   Ht 6' 3.5" (1.918 m)   Wt (!) 301 lb 12.8 oz (136.9 kg)   SpO2 94%   BMI 37.22 kg/m    Review of Systems     Objective:   Physical Exam    Lab Results  Component Value Date   HGBA1C 13.0 (A) 11/03/2021      Assessment & Plan:  Insulin-requiring type 2 DM: uncontrolled.    Patient Instructions  check your blood sugar twice a day.  vary the time of day when you check, between before the 3 meals, and at bedtime.  also check if you have symptoms of your blood sugar being too high or too low.  please keep a record of the readings and bring it to your next appointment here (or you can bring the meter itself).  You can  write it on any piece of paper.  please call us sooner if your blood sugar goes below 70, or if most of your readings are over 200.   I have sent prescriptions to your pharmacy, to increase the [place]je[place], and to change the Ozempic to Trulicity.   Please come back for a follow-up appointment in 2 months.

## 2021-11-03 NOTE — Patient Instructions (Addendum)
check your blood sugar twice a day.  vary the time of day when you check, between before the 3 meals, and at bedtime.  also check if you have symptoms of your blood sugar being too high or too low.  please keep a record of the readings and bring it to your next appointment here (or you can bring the meter itself).  You can write it on any piece of paper.  please call us sooner if your blood sugar goes below 70, or if most of your readings are over 200.   I have sent prescriptions to your pharmacy, to increase the Toujeo, and to change the Ozempic to Trulicity.   Please come back for a follow-up appointment in 2 months.

## 2021-12-03 DIAGNOSIS — U071 COVID-19: Secondary | ICD-10-CM | POA: Diagnosis not present

## 2021-12-03 DIAGNOSIS — J069 Acute upper respiratory infection, unspecified: Secondary | ICD-10-CM | POA: Diagnosis not present

## 2021-12-03 DIAGNOSIS — R051 Acute cough: Secondary | ICD-10-CM | POA: Diagnosis not present

## 2021-12-03 DIAGNOSIS — R0982 Postnasal drip: Secondary | ICD-10-CM | POA: Diagnosis not present

## 2021-12-04 ENCOUNTER — Ambulatory Visit: Payer: BLUE CROSS/BLUE SHIELD | Admitting: Podiatry

## 2022-01-06 ENCOUNTER — Ambulatory Visit (INDEPENDENT_AMBULATORY_CARE_PROVIDER_SITE_OTHER): Payer: BC Managed Care – PPO | Admitting: Endocrinology

## 2022-01-06 ENCOUNTER — Encounter: Payer: Self-pay | Admitting: Endocrinology

## 2022-01-06 ENCOUNTER — Other Ambulatory Visit: Payer: Self-pay

## 2022-01-06 VITALS — BP 142/90 | HR 57 | Ht 75.5 in | Wt 302.0 lb

## 2022-01-06 DIAGNOSIS — E119 Type 2 diabetes mellitus without complications: Secondary | ICD-10-CM

## 2022-01-06 LAB — POCT GLYCOSYLATED HEMOGLOBIN (HGB A1C): Hemoglobin A1C: 8.6 % — AB (ref 4.0–5.6)

## 2022-01-06 MED ORDER — DAPAGLIFLOZIN PROPANEDIOL 10 MG PO TABS: 10.0000 mg | ORAL_TABLET | Freq: Every day | ORAL | 3 refills | Status: DC

## 2022-01-06 MED ORDER — SEMAGLUTIDE (1 MG/DOSE) 4 MG/3ML ~~LOC~~ SOPN: 1.0000 mg | PEN_INJECTOR | SUBCUTANEOUS | 3 refills | Status: DC

## 2022-01-06 NOTE — Progress Notes (Signed)
Subjective:    Patient ID: Dennis Sims, male    DOB: 1966/06/03, 56 y.o.   MRN: [id number]  HPI Pt returns for f/u of diabetes mellitus: DM type: Insulin-requiring type 2 Dx'ed: [id number] Complications: stage 3 CRI Therapy: insulin since dx, metformin, and Ozempic DKA: never Severe hypoglycemia: never Pancreatitis: never Pancreatic imaging: normal in 2014 CT SDOH: he does not check cbg, but he never misses the insulin. Other: he is not a candidate for multiple daily injections.  Interval history: He took Ozempic, 1 mg/week, but he has been out x 4 week, due to lack of availability.  Pt says he does not miss the Lantus.  Past Medical History:  Diagnosis Date   Diabetes mellitus without complication ([place])    Diverticulitis    Hypertension    Plantar fasciitis    Sleep apnea     Past Surgical History:  Procedure Laterality Date   colonscopy     DENTAL SURGERY     FOOT SURGERY      Social History   Socioeconomic History   Marital status: Married    Spouse name: Not on file   Number of children: Not on file   Years of education: Not on file   Highest education level: Not on file  Occupational History   Not on file  Tobacco Use   Smoking status: Former    Packs/day: 1.50    Years: 25.00    Pack years: 37.50    Types: Cigarettes    Quit date: 08/07/2016    Years since quitting: 5.4   Smokeless tobacco: Never  Substance and Sexual Activity   Alcohol use: Yes    Comment: BEER AND ALCOHOL   Drug use: No   Sexual activity: Not on file  Other Topics Concern   Not on file  Social History Narrative   Own a Marketing executive business.    Social Determinants of Health   Financial Resource Strain: Not on file  Food Insecurity: Not on file  Transportation Needs: Not on file  Physical Activity: Not on file  Stress: Not on file  Social Connections: Not on file  Intimate Partner Violence: Not on file    Current Outpatient Medications on File Prior to Visit   Medication Sig Dispense Refill   atorvastatin (LIPITOR) 10 MG tablet TK 1 T PO QD     B-D UF III MINI PEN NEEDLES 31G X 5 MM MISC USE WITH INJECTION FOR LANTUS     bisoprolol (ZEBETA) 10 MG tablet TAKE 1 TABLET DAILY. 30 tablet 2   CHANTIX CONTINUING MONTH PAK 1 MG tablet TK 1 T PO BID     fluticasone furoate-vilanterol (BREO ELLIPTA) 100-25 MCG/INH AEPB Inhale 1 puff into the lungs daily. 28 each 0   indomethacin (INDOCIN) 50 MG capsule TK 1 C PO TID WF OR MILK FOR 7 DAYS     insulin glargine, 1 Unit Dial, (TOUJEO SOLOSTAR) 300 UNIT/ML Solostar Pen Inject 60 Units into the skin every morning. And pen needles 1/day 24 mL 3   losartan (COZAAR) 50 MG tablet TK 1 T PO QD     Melatonin 10 MG TABS Take 10 mg by mouth at bedtime.     metFORMIN (GLUCOPHAGE-XR) 500 MG 24 hr tablet Take 1 tablet (500 mg total) by mouth daily with breakfast. 90 tablet 3   TACLONEX external suspension Apply 1 application topically daily. Applies to left shin and right ankle for psoriasis  2   tadalafil (CIALIS)  5 MG tablet TK 1 T PO QD PRN     VENTOLIN HFA 108 (90 Base) MCG/ACT inhaler INHALE 2 PUFFES EVERY 4-6 HOURS FOR COUGH OR SHORTNESS OF BREATH. 18 g 0   No current facility-administered medications on file prior to visit.    Allergies  Allergen Reactions   Keflex [Cephalexin] Other (See Comments)    Upset stomach, felt overly tired and felt "crappy"   Oxycodone Rash    ITCHING AND UNABLE TO SLEEP        Family History  Problem Relation Age of Onset   Heart disease Father    Colon cancer Father    Bipolar disorder Brother    Diabetes Neg Hx     BP (!) 142/90 (BP Location: Left Arm, Patient Position: Sitting, Cuff Size: Normal)    Pulse (!) 57    Ht 6' 3.5" (1.918 m)    Wt (!) 302 lb (137 kg)    SpO2 94%    BMI 37.25 kg/m    Review of Systems He has lost 15 lbs since last ov.  He has mild HB.      Objective:   Physical Exam    A1c=8.6%    Assessment & Plan:  Insulin-requiring type 2 DM:  uncontrolled  Patient Instructions  check your blood sugar twice a day.  vary the time of day when you check, between before the 3 meals, and at bedtime.  also check if you have symptoms of your blood sugar being too high or too low.  please keep a record of the readings and bring it to your next appointment here (or you can bring the meter itself).  You can write it on any piece of paper.  please call us sooner if your blood sugar goes below 70, or if most of your readings are over 200.   I have sent 2 prescriptions to your pharmacy: to start [place], and to resume the Ozempic.   Please continue the same insulin.  Please come back for a follow-up appointment in 2 months.

## 2022-01-06 NOTE — Patient Instructions (Addendum)
check your blood sugar twice a day.  vary the time of day when you check, between before the 3 meals, and at bedtime.  also check if you have symptoms of your blood sugar being too high or too low.  please keep a record of the readings and bring it to your next appointment here (or you can bring the meter itself).  You can write it on any piece of paper.  please call us sooner if your blood sugar goes below 70, or if most of your readings are over 200.   I have sent 2 prescriptions to your pharmacy: to start Farxiga, and to resume the Ozempic.   Please continue the same insulin.  Please come back for a follow-up appointment in 2 months.

## 2022-02-10 ENCOUNTER — Encounter: Payer: Self-pay | Admitting: Podiatry

## 2022-02-10 ENCOUNTER — Ambulatory Visit: Payer: BC Managed Care – PPO | Admitting: Podiatry

## 2022-02-10 ENCOUNTER — Other Ambulatory Visit: Payer: Self-pay

## 2022-02-10 DIAGNOSIS — E1142 Type 2 diabetes mellitus with diabetic polyneuropathy: Secondary | ICD-10-CM

## 2022-02-10 DIAGNOSIS — M79676 Pain in unspecified toe(s): Secondary | ICD-10-CM

## 2022-02-10 DIAGNOSIS — B351 Tinea unguium: Secondary | ICD-10-CM

## 2022-02-10 NOTE — Progress Notes (Signed)
He presents today chief complaint of painful elongated toenails bilaterally.  Objective: Pulses are palpable no open lesions or wounds.  Toenails are long thick yellow dystrophic onychomycotic painful palpation.  Assessment: Pain in limb secondary to onychomycosis.  Plan: Debridement of toenails 1 through 5 bilateral

## 2022-02-17 DIAGNOSIS — I129 Hypertensive chronic kidney disease with stage 1 through stage 4 chronic kidney disease, or unspecified chronic kidney disease: Secondary | ICD-10-CM | POA: Diagnosis not present

## 2022-02-17 DIAGNOSIS — N183 Chronic kidney disease, stage 3 unspecified: Secondary | ICD-10-CM | POA: Diagnosis not present

## 2022-02-17 DIAGNOSIS — E782 Mixed hyperlipidemia: Secondary | ICD-10-CM | POA: Diagnosis not present

## 2022-03-10 ENCOUNTER — Ambulatory Visit (INDEPENDENT_AMBULATORY_CARE_PROVIDER_SITE_OTHER): Payer: BC Managed Care – PPO | Admitting: Endocrinology

## 2022-03-10 ENCOUNTER — Other Ambulatory Visit: Payer: Self-pay

## 2022-03-10 VITALS — BP 134/82 | HR 56 | Wt 300.6 lb

## 2022-03-10 DIAGNOSIS — E119 Type 2 diabetes mellitus without complications: Secondary | ICD-10-CM

## 2022-03-10 LAB — POCT GLYCOSYLATED HEMOGLOBIN (HGB A1C): Hemoglobin A1C: 8.5 % — AB (ref 4.0–5.6)

## 2022-03-10 MED ORDER — SEMGLEE (YFGN) 100 UNIT/ML ~~LOC~~ SOPN: 60.0000 [IU] | PEN_INJECTOR | SUBCUTANEOUS | 3 refills | Status: DC

## 2022-03-10 MED ORDER — TOUJEO SOLOSTAR 300 UNIT/ML ~~LOC~~ SOPN: 60.0000 [IU] | PEN_INJECTOR | SUBCUTANEOUS | 3 refills | Status: DC

## 2022-03-10 MED ORDER — SEMAGLUTIDE (1 MG/DOSE) 4 MG/3ML ~~LOC~~ SOPN: 1.0000 mg | PEN_INJECTOR | SUBCUTANEOUS | 3 refills | Status: DC

## 2022-03-10 NOTE — Progress Notes (Signed)
? ?Subjective:  ? ? Patient ID: Dennis Sims, male    DOB: [birth year], 56 y.o.   MRN: [id number] ? ?HPI ?Pt returns for f/u of diabetes mellitus:  ?DM type: Insulin-requiring type 2 ?Dx'ed: 2019 ?Complications: stage 3 CRI ?Therapy: insulin since dx, metformin, and Ozempic.  ?DKA: never ?Severe hypoglycemia: never ?Pancreatitis: never ?Pancreatic imaging: normal in 2014 CT ?SDOH: he does not check cbg. ?Other: he is not a candidate for multiple daily injections, due to noncompliance.   ?Interval history: Pt says he does not miss the Lantus, but he has been taking just 40/d.  He has been missing the Ozempic  ?Past Medical History:  ?Diagnosis Date  ? Diabetes mellitus without complication ([place])   ? Diverticulitis   ? Hypertension   ? Plantar fasciitis   ? Sleep apnea   ? ? ?Past Surgical History:  ?Procedure Laterality Date  ? colonscopy    ? DENTAL SURGERY    ? FOOT SURGERY    ? ? ?Social History  ? ?Socioeconomic History  ? Marital status: Married  ?  Spouse name: Not on file  ? Number of children: Not on file  ? Years of education: Not on file  ? Highest education level: Not on file  ?Occupational History  ? Not on file  ?Tobacco Use  ? Smoking status: Former  ?  Packs/day: 1.50  ?  Years: 25.00  ?  Pack years: 37.50  ?  Types: Cigarettes  ?  Quit date: 08/07/2016  ?  Years since quitting: 5.5  ? Smokeless tobacco: Never  ?Substance and Sexual Activity  ? Alcohol use: Yes  ?  Comment: BEER AND ALCOHOL  ? Drug use: No  ? Sexual activity: Not on file  ?Other Topics Concern  ? Not on file  ?Social History Narrative  ? Own a Marketing executive business.   ? ?Social Determinants of Health  ? ?Financial Resource Strain: Not on file  ?Food Insecurity: Not on file  ?Transportation Needs: Not on file  ?Physical Activity: Not on file  ?Stress: Not on file  ?Social Connections: Not on file  ?Intimate Partner Violence: Not on file  ? ? ?Current Outpatient Medications on File Prior to Visit  ?Medication Sig Dispense Refill  ?  atorvastatin (LIPITOR) 10 MG tablet TK 1 T PO QD    ? B-D UF III MINI PEN NEEDLES 31G X 5 MM MISC USE WITH INJECTION FOR LANTUS    ? bisoprolol (ZEBETA) 5 MG tablet Take 5 mg by mouth daily.    ? CHANTIX CONTINUING MONTH PAK 1 MG tablet TK 1 T PO BID    ? dapagliflozin propanediol (FARXIGA) 10 MG TABS tablet Take 1 tablet (10 mg total) by mouth daily before breakfast. 90 tablet 3  ? fluticasone furoate-vilanterol (BREO ELLIPTA) 100-25 MCG/INH AEPB Inhale 1 puff into the lungs daily. 28 each 0  ? indomethacin (INDOCIN) 50 MG capsule TK 1 C PO TID WF OR MILK FOR 7 DAYS    ? losartan (COZAAR) 50 MG tablet TK 1 T PO QD    ? Melatonin 10 MG TABS Take 10 mg by mouth at bedtime.    ? OTEZLA 30 MG TABS Take 1 tablet by mouth 2 (two) times daily.    ? TACLONEX external suspension Apply 1 application topically daily. Applies to left shin and right ankle for psoriasis  2  ? tadalafil (CIALIS) 5 MG tablet TK 1 T PO QD PRN    ? VENTOLIN HFA  108 (90 Base) MCG/ACT inhaler INHALE 2 PUFFES EVERY 4-6 HOURS FOR COUGH OR SHORTNESS OF BREATH. 18 g 0  ? ?No current facility-administered medications on file prior to visit.  ? ? ?Allergies  ?Allergen Reactions  ? Keflex [Cephalexin] Other (See Comments)  ?  Upset stomach, felt overly tired and felt "crappy"  ? Oxycodone Rash  ?  ITCHING AND UNABLE TO SLEEP      ? ? ?Family History  ?Problem Relation Age of Onset  ? Heart disease Father   ? Colon cancer Father   ? Bipolar disorder Brother   ? Diabetes Neg Hx   ? ? ?BP 134/82 (BP Location: Left Arm, Patient Position: Sitting, Cuff Size: Normal)   Pulse (!) 56   Wt (!) 300 lb 9.6 oz (136.4 kg)   SpO2 95%   BMI 37.08 kg/m?  ? ? ?Review of Systems ?He denies hypoglycemia ?   ?Objective:  ? Physical Exam ?VITAL SIGNS:  See vs page ?GENERAL: no distress ? ? ? ?A1c=8.5% ?   ?Assessment & Plan:  ?Insulin-requiring type 2 DM: uncontrolled, due to noncompliance. ? ?Patient Instructions  ?check your blood sugar twice a day.  vary the time of day  when you check, between before the 3 meals, and at bedtime.  also check if you have symptoms of your blood sugar being too high or too low.  please keep a record of the readings and bring it to your next appointment here (or you can bring the meter itself).  You can write it on any piece of paper.  please call us sooner if your blood sugar goes below 70, or if most of your readings are over 200.   ?I have sent 2 prescriptions to your pharmacy: for Semglee 60 units each morning, and to resume the Ozempic.   ?Please continue the same [place].   ?Please stop taking the metformin.   ?Please come back for a follow-up appointment in 3 months.   ? ? ?

## 2022-03-10 NOTE — Patient Instructions (Addendum)
check your blood sugar twice a day.  vary the time of day when you check, between before the 3 meals, and at bedtime.  also check if you have symptoms of your blood sugar being too high or too low.  please keep a record of the readings and bring it to your next appointment here (or you can bring the meter itself).  You can write it on any piece of paper.  please call us sooner if your blood sugar goes below 70, or if most of your readings are over 200.   ?I have sent 2 prescriptions to your pharmacy: for Semglee 60 units each morning, and to resume the Ozempic.   ?Please continue the same [place].   ?Please stop taking the metformin.   ?Please come back for a follow-up appointment in 3 months.   ?

## 2022-03-21 DIAGNOSIS — J029 Acute pharyngitis, unspecified: Secondary | ICD-10-CM | POA: Diagnosis not present

## 2022-03-21 DIAGNOSIS — R051 Acute cough: Secondary | ICD-10-CM | POA: Diagnosis not present

## 2022-04-28 DIAGNOSIS — L820 Inflamed seborrheic keratosis: Secondary | ICD-10-CM | POA: Diagnosis not present

## 2022-04-28 DIAGNOSIS — L738 Other specified follicular disorders: Secondary | ICD-10-CM | POA: Diagnosis not present

## 2022-04-28 DIAGNOSIS — Z79899 Other long term (current) drug therapy: Secondary | ICD-10-CM | POA: Diagnosis not present

## 2022-04-28 DIAGNOSIS — L821 Other seborrheic keratosis: Secondary | ICD-10-CM | POA: Diagnosis not present

## 2022-04-28 DIAGNOSIS — L400 Psoriasis vulgaris: Secondary | ICD-10-CM | POA: Diagnosis not present

## 2022-05-07 ENCOUNTER — Other Ambulatory Visit: Payer: Self-pay | Admitting: Endocrinology

## 2022-05-07 ENCOUNTER — Other Ambulatory Visit (INDEPENDENT_AMBULATORY_CARE_PROVIDER_SITE_OTHER): Payer: BC Managed Care – PPO

## 2022-05-07 DIAGNOSIS — E1165 Type 2 diabetes mellitus with hyperglycemia: Secondary | ICD-10-CM | POA: Diagnosis not present

## 2022-05-07 LAB — MICROALBUMIN / CREATININE URINE RATIO
Creatinine,U: 187.4 mg/dL
Microalb Creat Ratio: 78.9 mg/g — ABNORMAL HIGH (ref 0.0–30.0)
Microalb, Ur: 147.8 mg/dL — ABNORMAL HIGH (ref 0.0–1.9)

## 2022-05-07 LAB — BASIC METABOLIC PANEL
BUN: 28 mg/dL — ABNORMAL HIGH (ref 6–23)
CO2: 27 mEq/L (ref 19–32)
Calcium: 9.2 mg/dL (ref 8.4–10.5)
Chloride: 103 mEq/L (ref 96–112)
Creatinine, Ser: 1.61 mg/dL — ABNORMAL HIGH (ref 0.40–1.50)
GFR: 47.04 mL/min — ABNORMAL LOW (ref >60.00)
Glucose, Bld: 206 mg/dL — ABNORMAL HIGH (ref 70–99)
Potassium: 4.2 mEq/L (ref 3.5–5.1)
Sodium: 138 mEq/L (ref 135–145)

## 2022-05-07 LAB — LIPID PANEL
Cholesterol: 190 mg/dL (ref 0–200)
HDL: 33.5 mg/dL — ABNORMAL LOW (ref >39.00)
LDL Cholesterol: 125 mg/dL — ABNORMAL HIGH (ref 0–99)
NonHDL: 156.77
Total CHOL/HDL Ratio: 6
Triglycerides: 161 mg/dL — ABNORMAL HIGH (ref 0.0–149.0)
VLDL: 32.2 mg/dL (ref 0.0–40.0)

## 2022-05-08 LAB — FRUCTOSAMINE: Fructosamine: 353 umol/L — ABNORMAL HIGH (ref 0–285)

## 2022-05-14 ENCOUNTER — Ambulatory Visit: Payer: BC Managed Care – PPO | Admitting: Endocrinology

## 2022-05-14 ENCOUNTER — Encounter: Payer: Self-pay | Admitting: Endocrinology

## 2022-05-14 VITALS — BP 122/84 | HR 65 | Ht 76.0 in | Wt 302.8 lb

## 2022-05-14 DIAGNOSIS — E1122 Type 2 diabetes mellitus with diabetic chronic kidney disease: Secondary | ICD-10-CM | POA: Diagnosis not present

## 2022-05-14 DIAGNOSIS — E7800 Pure hypercholesterolemia, unspecified: Secondary | ICD-10-CM | POA: Diagnosis not present

## 2022-05-14 DIAGNOSIS — E1165 Type 2 diabetes mellitus with hyperglycemia: Secondary | ICD-10-CM

## 2022-05-14 DIAGNOSIS — Z794 Long term (current) use of insulin: Secondary | ICD-10-CM | POA: Diagnosis not present

## 2022-05-14 DIAGNOSIS — N183 Chronic kidney disease, stage 3 unspecified: Secondary | ICD-10-CM

## 2022-05-14 DIAGNOSIS — I10 Essential (primary) hypertension: Secondary | ICD-10-CM

## 2022-05-14 DIAGNOSIS — I129 Hypertensive chronic kidney disease with stage 1 through stage 4 chronic kidney disease, or unspecified chronic kidney disease: Secondary | ICD-10-CM

## 2022-05-14 MED ORDER — OZEMPIC (0.25 OR 0.5 MG/DOSE) 2 MG/3ML ~~LOC~~ SOPN: 0.5000 mg | PEN_INJECTOR | SUBCUTANEOUS | 1 refills | Status: DC

## 2022-05-14 MED ORDER — ATORVASTATIN CALCIUM 10 MG PO TABS
ORAL_TABLET | ORAL | 1 refills | Status: DC
Start: 2022-05-14 — End: 2022-12-10

## 2022-05-14 NOTE — Patient Instructions (Signed)
1/2 Farxiga  Check blood sugars on waking up  Also check blood sugars about 2 hours after meals and do this after different meals by rotation  Recommended blood sugar levels on waking up are 90-130 and about 2 hours after meal is 130-180

## 2022-05-14 NOTE — Progress Notes (Signed)
Patient ID: Dennis Sims, male   DOB: 1966-03-19, 56 y.o.   MRN: [id number]           Reason for Appointment: Type II Diabetes follow-up   History of Present Illness   Diagnosis date: 2019  Previous history:  He has been on various medications inadequate control with range of A1c since 2022 has been 8.6 and 13.8 Ozempic was started in 9/22 Wilder Glade was started in 12/2021 BASAL insulin was started in 2020   Recent history:      Non-insulin hypoglycemic drugs: Ozempic 1 mg weekly, metformin 500 mg twice daily, Farxiga 10 mg daily     Insulin regimen: Semglee 60 units daily           Side effects from medications: None  Current self management, blood sugar patterns and problems identified:  A1c is most recently 8.5 Fructosamine is now 353 He does not like to check his blood sugars and has not done it for a while He has had consistent difficulty losing weight He does feel like he has more satiety with Ozempic but the last few months has had difficulty getting it consistently at the pharmacy and likely has missed at least 3 injections in the last couple of months He tolerates this well except for when he first starts 1 mg he will have heartburn He has not adjust his insulin lately and does not know how to do this Unclear what his blood sugars are after meals but his FASTING blood sugar in the lab was 206 He does take his insulin regularly    Exercise: None except being active at work  Diet management: Not following any specific meals  Hypoglycemia:  none                        Dietician visit: Most recent: Only a group session at   Weight control:  Wt Readings from Last 3 Encounters:  05/14/22 (!) 302 lb 12.8 oz (137.3 kg)  03/10/22 (!) 300 lb 9.6 oz (136.4 kg)  01/06/22 (!) 302 lb (137 kg)            Diabetes labs:  Lab Results  Component Value Date   HGBA1C 8.5 (A) 03/10/2022   HGBA1C 8.6 (A) 01/06/2022   HGBA1C 13.0 (A) 11/03/2021   Lab Results   Component Value Date   MICROALBUR 147.8 (H) 05/07/2022   LDLCALC 125 (H) 05/07/2022   CREATININE 1.61 (H) 05/07/2022     Allergies as of 05/14/2022       Reactions   Keflex [cephalexin] Other (See Comments)   Upset stomach, felt overly tired and felt "crappy"   Oxycodone Rash   ITCHING AND UNABLE TO SLEEP            Medication List        Accurate as of May 14, 2022 11:31 AM. If you have any questions, ask your nurse or doctor.          atorvastatin 10 MG tablet Commonly known as: LIPITOR TK 1 T PO QD   B-D UF III MINI PEN NEEDLES 31G X 5 MM Misc Generic drug: Insulin Pen Needle USE WITH INJECTION FOR LANTUS   bisoprolol 5 MG tablet Commonly known as: ZEBETA Take 5 mg by mouth daily.   Chantix Continuing Month Pak 1 MG tablet Generic drug: varenicline TK 1 T PO BID   dapagliflozin propanediol 10 MG Tabs tablet Commonly known as: Farxiga Take 1 tablet (10  mg total) by mouth daily before breakfast.   fluticasone furoate-vilanterol 100-25 MCG/INH Aepb Commonly known as: Breo Ellipta Inhale 1 puff into the lungs daily.   indomethacin 50 MG capsule Commonly known as: INDOCIN TK 1 C PO TID WF OR MILK FOR 7 DAYS   losartan 50 MG tablet Commonly known as: COZAAR TK 1 T PO QD   Melatonin 10 MG Tabs Take 10 mg by mouth at bedtime.   metFORMIN 500 MG tablet Commonly known as: GLUCOPHAGE Take 500 mg by mouth 2 (two) times daily.   Otezla 30 MG Tabs Generic drug: Apremilast Take 1 tablet by mouth 2 (two) times daily.   Semaglutide (1 MG/DOSE) 4 MG/3ML Sopn Inject 1 mg as directed once a week.   Semglee (yfgn) 100 UNIT/ML Pen Generic drug: insulin glargine-yfgn Inject 60 Units into the skin every morning. And pen needles 1/day   Taclonex external suspension Generic drug: calcipotriene-betamethasone Apply 1 application topically daily. Applies to left shin and right ankle for psoriasis   tadalafil 5 MG tablet Commonly known as: CIALIS TK 1 T PO QD  PRN   Ventolin HFA 108 (90 Base) MCG/ACT inhaler Generic drug: albuterol INHALE 2 PUFFES EVERY 4-6 HOURS FOR COUGH OR SHORTNESS OF BREATH.        Allergies:  Allergies  Allergen Reactions   Keflex [Cephalexin] Other (See Comments)    Upset stomach, felt overly tired and felt "crappy"   Oxycodone Rash    ITCHING AND UNABLE TO SLEEP        Past Medical History:  Diagnosis Date   Diabetes mellitus without complication ([place])    Diverticulitis    Hypertension    Plantar fasciitis    Sleep apnea     Past Surgical History:  Procedure Laterality Date   colonscopy     DENTAL SURGERY     FOOT SURGERY      Family History  Problem Relation Age of Onset   Heart disease Father    Colon cancer Father    Bipolar disorder Brother    Diabetes Neg Hx     Social History:  reports that he quit smoking about 5 years ago. His smoking use included cigarettes. He has a 37.50 pack-year smoking history. He has never used smokeless tobacco. He reports current alcohol use. He reports that he does not use drugs.  Review of Systems:  Last diabetic eye exam date  Last foot exam date:  History of erectile dysfunction  Hypertension: He has been on losartan and bisoprolol for this  BP Readings from Last 3 Encounters:  05/14/22 122/84  03/10/22 134/82  01/06/22 (!) 142/90    Lipids: On Lipitor 10 mg with his LDL being high as follows, unclear whether he has taken this recently    Lab Results  Component Value Date   CHOL 190 05/07/2022   Lab Results  Component Value Date   HDL 33.50 (L) 05/07/2022   Lab Results  Component Value Date   LDLCALC 125 (H) 05/07/2022   Lab Results  Component Value Date   TRIG 161.0 (H) 05/07/2022   Lab Results  Component Value Date   CHOLHDL 6 05/07/2022   No results found for: LDLDIRECT   Examination:   BP 122/84   Pulse 65   Ht 6\' 4"  (1.93 m)   Wt (!) 302 lb 12.8 oz (137.3 kg)   SpO2 92%   BMI 36.86 kg/m   Body mass index is 36.86  kg/m.    ASSESSMENT/  PLAN:    Diabetes type 2:   Current regimen: GLARGINE INSULIN, OZEMPIC, FARXIGA 500 MG METFORMIN  Blood glucose control continues to be suboptimal with fructosamine 353 and previous A1c 8.5 Recent fasting glucose 206 This is partly related to not getting his Ozempic prescription filled consistently but also likely can do better with diet and exercise  Blood sugar monitoring has been inadequate and he is not motivated to check as directed Likely will benefit from using the freestyle libre sensor Discussed in detail how the sensor will benefit him and how it is used Prescription for [place] 3 sensor will be sent and he will let us know if he needs any help getting started  Encouraged him to start brisk walking on at least the weekends for exercise Since he does not tolerate [place] because of polyuria he can try taking half a tablet Also likely can benefit from increasing Ozempic to 2 mg weekly with his next prescription, currently just restarting the dose and tends to have acid reflux with this Insulin doses will be adjusted based on his fasting blood sugars  HYPERCHOLESTEROLEMIA: Not clear if he is taking his prescription regularly and needs to discuss with PCP  Patient Instructions  1/2 Farxiga  Check blood sugars on waking up  Also check blood sugars about 2 hours after meals and do this after different meals by rotation  Recommended blood sugar levels on waking up are 90-130 and about 2 hours after meal is 130-180     Elayne Snare 05/14/2022, 11:31 AM

## 2022-05-26 DIAGNOSIS — Z79899 Other long term (current) drug therapy: Secondary | ICD-10-CM | POA: Diagnosis not present

## 2022-05-26 DIAGNOSIS — E782 Mixed hyperlipidemia: Secondary | ICD-10-CM | POA: Diagnosis not present

## 2022-05-26 DIAGNOSIS — N183 Chronic kidney disease, stage 3 unspecified: Secondary | ICD-10-CM | POA: Diagnosis not present

## 2022-05-26 DIAGNOSIS — Z125 Encounter for screening for malignant neoplasm of prostate: Secondary | ICD-10-CM | POA: Diagnosis not present

## 2022-05-26 DIAGNOSIS — E1122 Type 2 diabetes mellitus with diabetic chronic kidney disease: Secondary | ICD-10-CM | POA: Diagnosis not present

## 2022-05-28 ENCOUNTER — Ambulatory Visit: Payer: BC Managed Care – PPO | Admitting: Podiatry

## 2022-05-29 ENCOUNTER — Ambulatory Visit (INDEPENDENT_AMBULATORY_CARE_PROVIDER_SITE_OTHER): Payer: BC Managed Care – PPO | Admitting: Podiatry

## 2022-05-29 ENCOUNTER — Encounter: Payer: Self-pay | Admitting: Podiatry

## 2022-05-29 DIAGNOSIS — E1142 Type 2 diabetes mellitus with diabetic polyneuropathy: Secondary | ICD-10-CM

## 2022-05-29 DIAGNOSIS — B351 Tinea unguium: Secondary | ICD-10-CM | POA: Diagnosis not present

## 2022-05-29 DIAGNOSIS — M79676 Pain in unspecified toe(s): Secondary | ICD-10-CM | POA: Diagnosis not present

## 2022-05-29 NOTE — Progress Notes (Signed)
This patient returns to my office for at risk foot care.  This patient requires this care by a professional since this patient will be at risk due to having type 2 diabetes.  This patient is unable to cut nails himself since the patient cannot reach his nails.These nails are painful walking and wearing shoes.  This patient presents for at risk foot care today.  General Appearance  Alert, conversant and in no acute stress.  Vascular  Dorsalis pedis and posterior tibial  pulses are palpable  bilaterally.  Capillary return is within normal limits  bilaterally. Temperature is within normal limits  bilaterally.  Neurologic  Senn-Weinstein monofilament wire test within normal limits  bilaterally. Muscle power within normal limits bilaterally.  Nails Thick disfigured discolored nails with subungual debris  hallux toenails  bilaterally. No evidence of bacterial infection or drainage bilaterally.  Orthopedic  No limitations of motion  feet .  No crepitus or effusions noted.  No bony pathology or digital deformities noted.  Skin  normotropic skin with no porokeratosis noted bilaterally.  No signs of infections or ulcers noted.     Onychomycosis  Pain in right toes  Pain in left toes  Consent was obtained for treatment procedures.   Mechanical debridement of nails 1-5  bilaterally performed with a nail nipper.  Filed with dremel without incident.    Return office visit    6 months                 Told patient to return for periodic foot care and evaluation due to potential at risk complications.   Helane Gunther DPM

## 2022-06-29 DIAGNOSIS — N183 Chronic kidney disease, stage 3 unspecified: Secondary | ICD-10-CM | POA: Diagnosis not present

## 2022-06-29 DIAGNOSIS — E782 Mixed hyperlipidemia: Secondary | ICD-10-CM | POA: Diagnosis not present

## 2022-06-29 DIAGNOSIS — E1122 Type 2 diabetes mellitus with diabetic chronic kidney disease: Secondary | ICD-10-CM | POA: Diagnosis not present

## 2022-06-29 DIAGNOSIS — I129 Hypertensive chronic kidney disease with stage 1 through stage 4 chronic kidney disease, or unspecified chronic kidney disease: Secondary | ICD-10-CM | POA: Diagnosis not present

## 2022-07-29 DIAGNOSIS — L400 Psoriasis vulgaris: Secondary | ICD-10-CM | POA: Diagnosis not present

## 2022-07-29 DIAGNOSIS — Z79899 Other long term (current) drug therapy: Secondary | ICD-10-CM | POA: Diagnosis not present

## 2022-08-13 NOTE — Progress Notes (Deleted)
Patient ID: Dennis Sims, male   DOB: 1965/12/27, 56 y.o.   MRN: [id number]           Reason for Appointment: Type II Diabetes follow-up   History of Present Illness   Diagnosis date: 2019  Previous history:  He has been on various medications inadequate control with range of A1c since 2022 has been 8.6 and 13.8 Ozempic was started in 9/22 Marcelline Deist was started in 12/2021 BASAL insulin was started in 2020   Recent history:      Non-insulin hypoglycemic drugs: Ozempic 1 mg weekly, metformin 500 mg twice daily, Farxiga 10 mg daily     Insulin regimen: Semglee 60 units daily           Side effects from medications: None  Current self management, blood sugar patterns and problems identified:  A1c is most recently 8.5 Fructosamine is now 353 He does not like to check his blood sugars and has not done it for a while He has had consistent difficulty losing weight He does feel like he has more satiety with Ozempic but the last few months has had difficulty getting it consistently at the pharmacy and likely has missed at least 3 injections in the last couple of months He tolerates this well except for when he first starts 1 mg he will have heartburn He has not adjust his insulin lately and does not know how to do this Unclear what his blood sugars are after meals but his FASTING blood sugar in the lab was 206 He does take his insulin regularly    Exercise: None except being active at work  Diet management: Not following any specific meals  Hypoglycemia:  none                        Dietician visit: Most recent: Only a group session at   Weight control:  Wt Readings from Last 3 Encounters:  05/14/22 (!) 302 lb 12.8 oz (137.3 kg)  03/10/22 (!) 300 lb 9.6 oz (136.4 kg)  01/06/22 (!) 302 lb (137 kg)            Diabetes labs:  Lab Results  Component Value Date   HGBA1C 8.5 (A) 03/10/2022   HGBA1C 8.6 (A) 01/06/2022   HGBA1C 13.0 (A) 11/03/2021   Lab Results   Component Value Date   MICROALBUR 147.8 (H) 05/07/2022   LDLCALC 125 (H) 05/07/2022   CREATININE 1.61 (H) 05/07/2022     Allergies as of 08/14/2022       Reactions   Keflex [cephalexin] Other (See Comments)   Upset stomach, felt overly tired and felt "crappy"   Oxycodone Rash   ITCHING AND UNABLE TO SLEEP            Medication List        Accurate as of August 13, 2022  9:16 PM. If you have any questions, ask your nurse or doctor.          atorvastatin 10 MG tablet Commonly known as: LIPITOR 1 tablet daily   B-D UF III MINI PEN NEEDLES 31G X 5 MM Misc Generic drug: Insulin Pen Needle USE WITH INJECTION FOR LANTUS   bisoprolol 5 MG tablet Commonly known as: ZEBETA Take 5 mg by mouth daily.   Chantix Continuing Month Pak 1 MG tablet Generic drug: varenicline TK 1 T PO BID   dapagliflozin propanediol 10 MG Tabs tablet Commonly known as: Farxiga Take 1 tablet (10 mg  total) by mouth daily before breakfast.   fluticasone furoate-vilanterol 100-25 MCG/INH Aepb Commonly known as: Breo Ellipta Inhale 1 puff into the lungs daily.   indomethacin 50 MG capsule Commonly known as: INDOCIN TK 1 C PO TID WF OR MILK FOR 7 DAYS   losartan 50 MG tablet Commonly known as: COZAAR TK 1 T PO QD   Melatonin 10 MG Tabs Take 10 mg by mouth at bedtime.   metFORMIN 500 MG tablet Commonly known as: GLUCOPHAGE Take 500 mg by mouth 2 (two) times daily.   Otezla 30 MG Tabs Generic drug: Apremilast Take 1 tablet by mouth 2 (two) times daily.   Ozempic (0.25 or 0.5 MG/DOSE) 2 MG/3ML Sopn Generic drug: Semaglutide(0.25 or 0.5MG /DOS) Inject 0.5 mg into the skin once a week.   Semglee (yfgn) 100 UNIT/ML Pen Generic drug: insulin glargine-yfgn Inject 60 Units into the skin every morning. And pen needles 1/day   Taclonex external suspension Generic drug: calcipotriene-betamethasone Apply 1 application topically daily. Applies to left shin and right ankle for psoriasis    tadalafil 5 MG tablet Commonly known as: CIALIS TK 1 T PO QD PRN   Ventolin HFA 108 (90 Base) MCG/ACT inhaler Generic drug: albuterol INHALE 2 PUFFES EVERY 4-6 HOURS FOR COUGH OR SHORTNESS OF BREATH.        Allergies:  Allergies  Allergen Reactions   Keflex [Cephalexin] Other (See Comments)    Upset stomach, felt overly tired and felt "crappy"   Oxycodone Rash    ITCHING AND UNABLE TO SLEEP        Past Medical History:  Diagnosis Date   Diabetes mellitus without complication ([place])    Diverticulitis    Hypertension    Plantar fasciitis    Sleep apnea     Past Surgical History:  Procedure Laterality Date   colonscopy     DENTAL SURGERY     FOOT SURGERY      Family History  Problem Relation Age of Onset   Heart disease Father    Colon cancer Father    Bipolar disorder Brother    Diabetes Neg Hx     Social History:  reports that he quit smoking about 6 years ago. His smoking use included cigarettes. He has a 37.50 pack-year smoking history. He has never used smokeless tobacco. He reports current alcohol use. He reports that he does not use drugs.  Review of Systems:  Last diabetic eye exam date  Last foot exam date:  History of erectile dysfunction  Hypertension: He has been on losartan and bisoprolol for this  BP Readings from Last 3 Encounters:  05/14/22 122/84  03/10/22 134/82  01/06/22 (!) 142/90    Lipids: On Lipitor 10 mg with his LDL being high as follows, unclear whether he has taken this recently    Lab Results  Component Value Date   CHOL 190 05/07/2022   Lab Results  Component Value Date   HDL 33.50 (L) 05/07/2022   Lab Results  Component Value Date   LDLCALC 125 (H) 05/07/2022   Lab Results  Component Value Date   TRIG 161.0 (H) 05/07/2022   Lab Results  Component Value Date   CHOLHDL 6 05/07/2022   No results found for: "LDLDIRECT"   Examination:   There were no vitals taken for this visit.  There is no height or  weight on file to calculate BMI.    ASSESSMENT/ PLAN:    Diabetes type 2:   Current regimen: GLARGINE INSULIN,  OZEMPIC, FARXIGA 500 MG METFORMIN  Blood glucose control continues to be suboptimal with fructosamine 353 and previous A1c 8.5 Recent fasting glucose 206 This is partly related to not getting his Ozempic prescription filled consistently but also likely can do better with diet and exercise  Blood sugar monitoring has been inadequate and he is not motivated to check as directed Likely will benefit from using the freestyle libre sensor Discussed in detail how the sensor will benefit him and how it is used Prescription for [place] 3 sensor will be sent and he will let us know if he needs any help getting started  Encouraged him to start brisk walking on at least the weekends for exercise Since he does not tolerate [place] because of polyuria he can try taking half a tablet Also likely can benefit from increasing Ozempic to 2 mg weekly with his next prescription, currently just restarting the dose and tends to have acid reflux with this Insulin doses will be adjusted based on his fasting blood sugars  HYPERCHOLESTEROLEMIA: Not clear if he is taking his prescription regularly and needs to discuss with PCP  There are no Patient Instructions on file for this visit.   Reather Littler 08/13/2022, 9:16 PM

## 2022-08-14 ENCOUNTER — Ambulatory Visit: Payer: BC Managed Care – PPO | Admitting: Endocrinology

## 2022-08-18 DIAGNOSIS — J029 Acute pharyngitis, unspecified: Secondary | ICD-10-CM | POA: Diagnosis not present

## 2022-08-18 DIAGNOSIS — R52 Pain, unspecified: Secondary | ICD-10-CM | POA: Diagnosis not present

## 2022-08-18 DIAGNOSIS — R509 Fever, unspecified: Secondary | ICD-10-CM | POA: Diagnosis not present

## 2022-08-18 DIAGNOSIS — U071 COVID-19: Secondary | ICD-10-CM | POA: Diagnosis not present

## 2022-08-18 DIAGNOSIS — R059 Cough, unspecified: Secondary | ICD-10-CM | POA: Diagnosis not present

## 2022-09-04 ENCOUNTER — Telehealth: Payer: Self-pay | Admitting: Endocrinology

## 2022-09-04 MED ORDER — SEMGLEE (YFGN) 100 UNIT/ML ~~LOC~~ SOPN: 60.0000 [IU] | PEN_INJECTOR | SUBCUTANEOUS | 1 refills | Status: DC

## 2022-09-04 NOTE — Telephone Encounter (Signed)
MEDICATION: Semglee  PHARMACY:  [facility]  HAS THE PATIENT CONTACTED THEIR PHARMACY?  YES  IS THIS A 90 DAY SUPPLY : unknown  IS PATIENT OUT OF MEDICATION: yes  IF NOT; HOW MUCH IS LEFT:   LAST APPOINTMENT DATE: @06 /01/23  NEXT APPOINTMENT DATE:@10 /02/2022  DO WE HAVE YOUR PERMISSION TO LEAVE A DETAILED MESSAGE?: yes  OTHER COMMENTS:    **Let patient know to contact pharmacy at the end of the day to make sure medication is ready. **  ** Please notify patient to allow 48-72 hours to process**  **Encourage patient to contact the pharmacy for refills or they can request refills through [facility]**

## 2022-09-04 NOTE — Telephone Encounter (Signed)
done

## 2022-09-15 ENCOUNTER — Telehealth: Payer: Self-pay | Admitting: Endocrinology

## 2022-09-15 ENCOUNTER — Other Ambulatory Visit: Payer: BC Managed Care – PPO

## 2022-09-15 NOTE — Telephone Encounter (Signed)
Patient called to reschedule his appointment, but also said that after his last visit in June 2023 that he was supposed to be set up with a meter, but to date has not heard anything to get this set up.

## 2022-09-16 ENCOUNTER — Ambulatory Visit: Payer: BC Managed Care – PPO | Admitting: Endocrinology

## 2022-09-24 ENCOUNTER — Telehealth: Payer: Self-pay | Admitting: Dietician

## 2022-09-24 NOTE — Telephone Encounter (Signed)
Called patient to arrange [place] training. He does not have the [place] yet. Discussed for him to call us when he gets the [place] for an appointment for training. 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  Will contact the medical assistant regarding the [place] order.  Antonieta Iba, RD, LDN, CDCES

## 2022-10-12 ENCOUNTER — Ambulatory Visit: Payer: BC Managed Care – PPO | Admitting: Podiatry

## 2022-10-12 ENCOUNTER — Encounter: Payer: Self-pay | Admitting: Podiatry

## 2022-10-12 DIAGNOSIS — E1142 Type 2 diabetes mellitus with diabetic polyneuropathy: Secondary | ICD-10-CM

## 2022-10-12 DIAGNOSIS — L409 Psoriasis, unspecified: Secondary | ICD-10-CM

## 2022-10-12 DIAGNOSIS — B351 Tinea unguium: Secondary | ICD-10-CM

## 2022-10-12 DIAGNOSIS — M79676 Pain in unspecified toe(s): Secondary | ICD-10-CM | POA: Diagnosis not present

## 2022-10-12 NOTE — Progress Notes (Signed)
This patient returns to my office for at risk foot care.  This patient requires this care by a professional since this patient will be at risk due to having type 2 diabetes.  This patient is unable to cut nails himself since the patient cannot reach his nails.These nails are painful walking and wearing shoes.  This patient presents for at risk foot care today.  General Appearance  Alert, conversant and in no acute stress.  Vascular  Dorsalis pedis and posterior tibial  pulses are palpable  bilaterally.  Capillary return is within normal limits  bilaterally. Temperature is within normal limits  bilaterally.  Neurologic  Senn-Weinstein monofilament wire test within normal limits  bilaterally. Muscle power within normal limits bilaterally.  Nails Thick disfigured discolored nails with subungual debris  hallux toenails  bilaterally. No evidence of bacterial infection or drainage bilaterally.  Orthopedic  No limitations of motion  feet .  No crepitus or effusions noted.  No bony pathology or digital deformities noted.  Skin  normotropic skin with no porokeratosis noted bilaterally.  No signs of infections or ulcers noted.     Onychomycosis  Pain in right toes  Pain in left toes  Consent was obtained for treatment procedures.   Mechanical debridement of nails 1-5  bilaterally performed with a nail nipper.  Filed with dremel without incident.    Return office visit    4 months                 Told patient to return for periodic foot care and evaluation due to potential at risk complications.   Gardiner Barefoot DPM

## 2022-10-19 ENCOUNTER — Telehealth: Payer: Self-pay

## 2022-10-19 NOTE — Patient Instructions (Signed)
Visit Information  Thank you for taking time to visit with me today. Please don't hesitate to contact me if I can be of assistance to you.   Following are the goals we discussed today:   Goals Addressed             This Visit's Progress    COMPLETED: Care Coordination Activities - no follow up required       Care Coordination Interventions: Provided education to patient re: care coordination services, follow up with providers Reviewed scheduled/upcoming provider appointments including lab and endocrinologist Assessed social determinant of health barriers          If you are experiencing a Mental Health or [place] or need someone to talk to, please call the Suicide and Crisis Lifeline: 988 call the [place] National Suicide Prevention Lifeline: 8548372927 or TTY: (726)271-5059 TTY 716 245 7981) to talk to a trained counselor call 1-800-273-TALK (toll free, 24 hour hotline) go to [facility] [place] [phone]) call 911   The patient verbalized understanding of instructions, educational materials, and care plan provided today and DECLINED offer to receive copy of patient instructions, educational materials, and care plan.   No further follow up required:    Peter Garter RN, Jackquline [place], [place] [facility] [phone]

## 2022-10-19 NOTE — Patient Outreach (Signed)
  Care Coordination   Initial Visit Note   10/19/2022 Name: Dennis Sims MRN: [id number] DOB: 06-Oct-1966  Dennis Sims is a 56 y.o. year old male who sees Kathyrn Lass, MD for primary care. I spoke with  Barnie Alderman by phone today.  What matters to the patients health and wellness today?  Denies any concerns today States I am doing OK with my diabetes and do not need help    Goals Addressed             This Visit's Progress    COMPLETED: Care Coordination Activities - no follow up required       Care Coordination Interventions: Provided education to patient re: care coordination services, follow up with providers Reviewed scheduled/upcoming provider appointments including lab and endocrinologist Assessed social determinant of health barriers          SDOH assessments and interventions completed:  Yes  SDOH Interventions Today    Flowsheet Row Most Recent Value  SDOH Interventions   Food Insecurity Interventions Intervention Not Indicated  Housing Interventions Intervention Not Indicated  Transportation Interventions Intervention Not Indicated  Utilities Interventions Intervention Not Indicated  Financial Strain Interventions Intervention Not Indicated        Care Coordination Interventions Activated:  Yes  Care Coordination Interventions:  Yes, provided   Follow up plan: No further intervention required.   Encounter Outcome:  Pt. Visit Completed  Peter Garter RN, BSN,CCM, CDE Care Management Coordinator [facility] [phone]

## 2022-11-12 ENCOUNTER — Other Ambulatory Visit (INDEPENDENT_AMBULATORY_CARE_PROVIDER_SITE_OTHER): Payer: BC Managed Care – PPO

## 2022-11-12 DIAGNOSIS — E1165 Type 2 diabetes mellitus with hyperglycemia: Secondary | ICD-10-CM

## 2022-11-12 DIAGNOSIS — E7800 Pure hypercholesterolemia, unspecified: Secondary | ICD-10-CM | POA: Diagnosis not present

## 2022-11-12 DIAGNOSIS — Z794 Long term (current) use of insulin: Secondary | ICD-10-CM | POA: Diagnosis not present

## 2022-11-12 LAB — COMPREHENSIVE METABOLIC PANEL
ALT: 25 U/L (ref 0–53)
AST: 22 U/L (ref 0–37)
Albumin: 4.3 g/dL (ref 3.5–5.2)
Alkaline Phosphatase: 56 U/L (ref 39–117)
BUN: 29 mg/dL — ABNORMAL HIGH (ref 6–23)
CO2: 30 mEq/L (ref 19–32)
Calcium: 9.5 mg/dL (ref 8.4–10.5)
Chloride: 101 mEq/L (ref 96–112)
Creatinine, Ser: 1.74 mg/dL — ABNORMAL HIGH (ref 0.40–1.50)
GFR: 42.7 mL/min — ABNORMAL LOW (ref >60.00)
Glucose, Bld: 217 mg/dL — ABNORMAL HIGH (ref 70–99)
Potassium: 4.1 mEq/L (ref 3.5–5.1)
Sodium: 138 mEq/L (ref 135–145)
Total Bilirubin: 1.7 mg/dL — ABNORMAL HIGH (ref 0.2–1.2)
Total Protein: 7.4 g/dL (ref 6.0–8.3)

## 2022-11-12 LAB — LIPID PANEL
Cholesterol: 174 mg/dL (ref 0–200)
HDL: 34.8 mg/dL — ABNORMAL LOW (ref >39.00)
LDL Cholesterol: 107 mg/dL — ABNORMAL HIGH (ref 0–99)
NonHDL: 139.27
Total CHOL/HDL Ratio: 5
Triglycerides: 161 mg/dL — ABNORMAL HIGH (ref 0.0–149.0)
VLDL: 32.2 mg/dL (ref 0.0–40.0)

## 2022-11-12 LAB — HEMOGLOBIN A1C: Hgb A1c MFr Bld: 11.9 % — ABNORMAL HIGH (ref 4.6–6.5)

## 2022-11-18 ENCOUNTER — Encounter: Payer: Self-pay | Admitting: Endocrinology

## 2022-11-18 ENCOUNTER — Ambulatory Visit: Payer: BC Managed Care – PPO | Admitting: Endocrinology

## 2022-11-18 VITALS — BP 122/82 | HR 75 | Ht 76.0 in | Wt 302.6 lb

## 2022-11-18 DIAGNOSIS — Z794 Long term (current) use of insulin: Secondary | ICD-10-CM

## 2022-11-18 DIAGNOSIS — E7800 Pure hypercholesterolemia, unspecified: Secondary | ICD-10-CM

## 2022-11-18 DIAGNOSIS — E1165 Type 2 diabetes mellitus with hyperglycemia: Secondary | ICD-10-CM

## 2022-11-18 MED ORDER — FREESTYLE LIBRE 3 SENSOR MISC: 1.0000 | 2 refills | Status: DC

## 2022-11-18 MED ORDER — NOVOLOG FLEXPEN 100 UNIT/ML ~~LOC~~ SOPN: PEN_INJECTOR | SUBCUTANEOUS | 1 refills | Status: DC

## 2022-11-18 MED ORDER — DAPAGLIFLOZIN PROPANEDIOL 10 MG PO TABS: 10.0000 mg | ORAL_TABLET | Freq: Every day | ORAL | 3 refills | Status: DC

## 2022-11-18 NOTE — Patient Instructions (Signed)
Check blood sugars on waking up days a week  Also check blood sugars about 2 hours after meals and do this after different meals by rotation  Recommended blood sugar levels on waking up are 90-180 and about 2 hours after meal is 130-160  Please bring your blood sugar monitor to each visit, thank you  Novolog at start on dinner meals

## 2022-11-18 NOTE — Progress Notes (Signed)
Patient ID: Dennis Sims, male   DOB: [birth year], 56 y.o.   MRN: [id number]           Reason for Appointment: Type II Diabetes follow-up   History of Present Illness   Diagnosis date: 2019  Previous history:  He has been on various medications inadequate control with range of A1c since 2022 has been 8.6 and 13.8 Ozempic was started in 9/22 Marcelline Deist was started in 12/2021 BASAL insulin was started in 2020   Recent history:      Non-insulin hypoglycemic drugs: Ozempic 1 mg weekly,      Insulin regimen: Semglee 60 units daily           Side effects from medications: None  Current self management, blood sugar patterns and problems identified:  A1c is now 11.9 compared to 8.5 earlier this year Last visit was in 6/23 He does not like to check his blood sugars and still has not done it despite reminders and he says that his lancet device is broken Previously freestyle Josephine Igo was denied by his insurance  Also has not taken [place] not done it for a while and not clear why Metformin was stopped because of renal dysfunction He has had some better satiety with going up to 1 mg Ozempic but has not lost any weight FBS 217 He does take his basal insulin regularly    Exercise: None except being active at work  Diet management: Not following any meal plan  Hypoglycemia:  none              Dietician visit: Most recent: Only a group session in the past   Weight control:  Wt Readings from Last 3 Encounters:  11/18/22 (!) 302 lb 9.6 oz (137.3 kg)  05/14/22 (!) 302 lb 12.8 oz (137.3 kg)  03/10/22 (!) 300 lb 9.6 oz (136.4 kg)            Diabetes labs:  Lab Results  Component Value Date   HGBA1C 11.9 (H) 11/12/2022   HGBA1C 8.5 (A) 03/10/2022   HGBA1C 8.6 (A) 01/06/2022   Lab Results  Component Value Date   MICROALBUR 147.8 (H) 05/07/2022   LDLCALC 107 (H) 11/12/2022   CREATININE 1.74 (H) 11/12/2022     Allergies as of 11/18/2022       Reactions   Keflex  [cephalexin] Other (See Comments)   Upset stomach, felt overly tired and felt "crappy"   Oxycodone Rash   ITCHING AND UNABLE TO SLEEP            Medication List        Accurate as of November 18, 2022  2:49 PM. If you have any questions, ask your nurse or doctor.          STOP taking these medications    indomethacin 50 MG capsule Commonly known as: INDOCIN Stopped by: Reather Littler, MD       TAKE these medications    atorvastatin 10 MG tablet Commonly known as: LIPITOR 1 tablet daily   B-D UF III MINI PEN NEEDLES 31G X 5 MM Misc Generic drug: Insulin Pen Needle USE WITH INJECTION FOR LANTUS   bisoprolol 5 MG tablet Commonly known as: ZEBETA Take 5 mg by mouth daily.   Chantix Continuing Month Pak 1 MG tablet Generic drug: varenicline TK 1 T PO BID   dapagliflozin propanediol 10 MG Tabs tablet Commonly known as: Farxiga Take 1 tablet (10 mg total) by mouth daily before breakfast.  fluticasone furoate-vilanterol 100-25 MCG/INH Aepb Commonly known as: Breo Ellipta Inhale 1 puff into the lungs daily.   FreeStyle Libre 3 Sensor Misc 1 Device by Does not apply route every 14 (fourteen) days. Apply 1 sensor on upper arm every 14 days for continuous glucose monitoring Started by: Reather Littler, MD   losartan 50 MG tablet Commonly known as: COZAAR TK 1 T PO QD   Melatonin 10 MG Tabs Take 10 mg by mouth at bedtime.   metFORMIN 500 MG tablet Commonly known as: GLUCOPHAGE Take 500 mg by mouth 2 (two) times daily.   NovoLOG FlexPen 100 UNIT/ML FlexPen Generic drug: insulin aspart Upto 25 U ac tid Started by: Reather Littler, MD   Otezla 30 MG Tabs Generic drug: Apremilast Take 1 tablet by mouth 2 (two) times daily.   Ozempic (0.25 or 0.5 MG/DOSE) 2 MG/3ML Sopn Generic drug: Semaglutide(0.25 or 0.5MG /DOS) Inject 0.5 mg into the skin once a week.   Semglee (yfgn) 100 UNIT/ML Pen Generic drug: insulin glargine-yfgn Inject 60 Units into the skin every morning.  And pen needles 1/day   Taclonex external suspension Generic drug: calcipotriene-betamethasone Apply 1 application topically daily. Applies to left shin and right ankle for psoriasis   tadalafil 5 MG tablet Commonly known as: CIALIS TK 1 T PO QD PRN   Tremfya 100 MG/ML Sopn Generic drug: Guselkumab Inject into the skin.   Ventolin HFA 108 (90 Base) MCG/ACT inhaler Generic drug: albuterol INHALE 2 PUFFES EVERY 4-6 HOURS FOR COUGH OR SHORTNESS OF BREATH.        Allergies:  Allergies  Allergen Reactions   Keflex [Cephalexin] Other (See Comments)    Upset stomach, felt overly tired and felt "crappy"   Oxycodone Rash    ITCHING AND UNABLE TO SLEEP        Past Medical History:  Diagnosis Date   Diabetes mellitus without complication (HCC)    Diverticulitis    Hypertension    Plantar fasciitis    Sleep apnea     Past Surgical History:  Procedure Laterality Date   colonscopy     DENTAL SURGERY     FOOT SURGERY      Family History  Problem Relation Age of Onset   Heart disease Father    Colon cancer Father    Bipolar disorder Brother    Diabetes Neg Hx     Social History:  reports that he quit smoking about 6 years ago. His smoking use included cigarettes. He has a 37.50 pack-year smoking history. He has never used smokeless tobacco. He reports current alcohol use. He reports that he does not use drugs.  Review of Systems:  History of erectile dysfunction  Hypertension: He has been on losartan and bisoprolol for this  BP Readings from Last 3 Encounters:  11/18/22 122/82  05/14/22 122/84  03/10/22 134/82   Lab Results  Component Value Date   CREATININE 1.74 (H) 11/12/2022   CREATININE 1.61 (H) 05/07/2022   CREATININE 1.49 (H) 07/29/2016    Lipids: On Lipitor 10 mg with his LDL only slightly better, has been irregular with Lipitor lately    Lab Results  Component Value Date   CHOL 174 11/12/2022   CHOL 190 05/07/2022   Lab Results  Component  Value Date   HDL 34.80 (L) 11/12/2022   HDL 33.50 (L) 05/07/2022   Lab Results  Component Value Date   LDLCALC 107 (H) 11/12/2022   LDLCALC 125 (H) 05/07/2022   Lab Results  Component  Value Date   TRIG 161.0 (H) 11/12/2022   TRIG 161.0 (H) 05/07/2022   Lab Results  Component Value Date   CHOLHDL 5 11/12/2022   CHOLHDL 6 05/07/2022   No results found for: "LDLDIRECT"   Examination:   BP 122/82   Pulse 75   Ht 6\' 4"  (1.93 m)   Wt (!) 302 lb 9.6 oz (137.3 kg)   SpO2 98%   BMI 36.83 kg/m   Body mass index is 36.83 kg/m.    ASSESSMENT/ PLAN:    Diabetes type 2:   Current regimen: GLARGINE INSULIN, OZEMPIC 1 mg  His A1c is markedly higher at 11.9  Discussed that his A1c indicates insulin deficiency especially with his A1c equivalent being about 300 and his fasting readings recently only about 200 He has been taking his Ozempic fairly regularly Blood sugar has not been monitored at home   Blood sugar monitoring has been inadequate and he is not motivated to check as directed Likely will benefit from using the freestyle libre sensor Discussed in detail how the sensor will benefit him and how it is used Prescription for [place] 3 sensor will be sent and he will let Esen know if he needs any help getting started  He is reluctant to take multiple injections but at least can start taking mealtime injection at dinnertime Also may be able to get approval for the freestyle libre version 3 now with basal bolus insulin  Today discussed in detail the need for mealtime insulin to cover postprandial spikes, action of mealtime insulin, use of the insulin pen, timing and action of the rapid acting insulin as well as starting dose and dosage titration to target the two-hour reading of under 180 He will start with 15 units of NovoLog and go up 3 to 4 units if his blood sugars are consistently over 180 Discussed that we may consider adding this at breakfast and lunch also Also may  consider increasing Semglee if his fasting readings are consistently over at least 150 Restart FARXIGA Follow-up with diabetes educator More regular follow-up  Needs to take his Lipitor regularly for high lipids  Patient Instructions  Check blood sugars on waking up days a week  Also check blood sugars about 2 hours after meals and do this after different meals by rotation  Recommended blood sugar levels on waking up are 90-180 and about 2 hours after meal is 130-160  Please bring your blood sugar monitor to each visit, thank you  Novolog at start on dinner meals   Dennis Sims 11/18/2022, 2:49 PM

## 2022-12-10 ENCOUNTER — Telehealth: Payer: Self-pay

## 2022-12-10 ENCOUNTER — Other Ambulatory Visit: Payer: Self-pay | Admitting: Endocrinology

## 2022-12-10 DIAGNOSIS — E1165 Type 2 diabetes mellitus with hyperglycemia: Secondary | ICD-10-CM

## 2022-12-10 MED ORDER — SEMAGLUTIDE (2 MG/DOSE) 8 MG/3ML ~~LOC~~ SOPN: 2.0000 mg | PEN_INJECTOR | SUBCUTANEOUS | 1 refills | Status: DC

## 2022-12-10 NOTE — Telephone Encounter (Signed)
Rx sent 

## 2022-12-10 NOTE — Telephone Encounter (Signed)
Patient called in states you wanted him to call when he was done with the 1mg  ozempic to increase to 2mg  ozempic. Please advise

## 2022-12-29 ENCOUNTER — Encounter: Payer: BC Managed Care – PPO | Attending: Endocrinology | Admitting: Nutrition

## 2022-12-29 DIAGNOSIS — N183 Chronic kidney disease, stage 3 unspecified: Secondary | ICD-10-CM | POA: Diagnosis not present

## 2022-12-29 DIAGNOSIS — E1122 Type 2 diabetes mellitus with diabetic chronic kidney disease: Secondary | ICD-10-CM | POA: Insufficient documentation

## 2022-12-29 DIAGNOSIS — I129 Hypertensive chronic kidney disease with stage 1 through stage 4 chronic kidney disease, or unspecified chronic kidney disease: Secondary | ICD-10-CM | POA: Insufficient documentation

## 2022-12-29 NOTE — Progress Notes (Signed)
Patient was trained on the use of the [place].  The readings are going to his phone.  App was downloaded with much assistance, and this was linked to Clarity and to [place].  Discussed goals for blood sugar readings:  less than 120 ac and 130-160 2hr. Pc readings.   Discussed also type II diabetes and what he can do to decrease his insulin resisitance--diet, weight loss and exercise.  He believes that [employer] will help him and that carbs are bad.  Discussed this and patient is eating a can of nuts and high fat snacks like cheese and dips.  Discussed calorie control and need for good carbs like fruit and whole grain carbs that are low in fat.  Also discussed importance of insulin to decrease insulin resisitance.  Suggested 30-40 minutes of continuous exercise, despite being active at work. 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 He was appreciative of the advise and had no final questions

## 2022-12-30 NOTE — Patient Instructions (Addendum)
Change sensor every 10 days Call Dexcom help line if questions Keep meat portions sizes to no more than 4-5 ounces at a meal Snack on raw veg, with light dip or salsa, and stop nuts, and cheese Exercise for 30 minutes 4-5 days/wk.

## 2023-01-05 ENCOUNTER — Encounter: Payer: Self-pay | Admitting: Endocrinology

## 2023-01-05 ENCOUNTER — Ambulatory Visit (INDEPENDENT_AMBULATORY_CARE_PROVIDER_SITE_OTHER): Payer: BC Managed Care – PPO | Admitting: Endocrinology

## 2023-01-05 VITALS — BP 126/84 | HR 63 | Ht 76.0 in | Wt 300.0 lb

## 2023-01-05 DIAGNOSIS — E1165 Type 2 diabetes mellitus with hyperglycemia: Secondary | ICD-10-CM

## 2023-01-05 DIAGNOSIS — E7800 Pure hypercholesterolemia, unspecified: Secondary | ICD-10-CM

## 2023-01-05 DIAGNOSIS — Z794 Long term (current) use of insulin: Secondary | ICD-10-CM

## 2023-01-05 NOTE — Patient Instructions (Addendum)
Semglee 55 units in am  8-10 Novolog at lunch unless small meal

## 2023-01-05 NOTE — Progress Notes (Signed)
Patient ID: Dennis Sims, male   DOB: 01-11-1966, 57 y.o.   MRN: [id number]           Reason for Appointment: Type II Diabetes follow-up   History of Present Illness   Diagnosis date: 2019  Previous history:  He has been on various medications inadequate control with range of A1c since 2022 has been 8.6 and 13.8 Ozempic was started in 9/22 Wilder Glade was started in 12/2021 BASAL insulin was started in 2020   Recent history:      Non-insulin hypoglycemic drugs: Ozempic 1 mg weekly,      Insulin regimen: Semglee 60 units daily, NovoLog 15 units before dinner           Side effects from medications: None  Current self management, blood sugar patterns and problems identified:  A1c is last 11.9 and no recent labs available  He was started on mealtime insulin at dinnertime on his last visit and followed up with diabetes educator last week  He has also taken his Wilder Glade as directed since his last visit about 6 weeks ago Also he has been able to start on the freestyle libre version 3 about a week ago With starting insulin at dinnertime his blood sugars are much improved after supper and only rarely above target Currently appears to be having periodically high readings after lunch This is likely to be from his eating home and restaurant including fast food regularly Only occasionally has high readings after breakfast Although he has been asymptomatic he has woken up around the 2-3 AM with low sugar alarms  No hypoglycemia during the day and highest readings are overall early afternoon He does take his basal insulin in the morning but does not understand the actions of this  Exercise: He states he is physically active at work  [employer] as follows  Overnight blood sugars are excellent with averages in the 90s and only gradually increasing after 6 AM; hypoglycemia noticed with minimally low readings on 2 or 3 occasions around 1-2 AM POSTPRANDIAL readings are  generally mildly increased after breakfast but more so after lunch with some variability Blood sugars also are generally well-controlled after dinner with only occasional blood sugar spikes Premeal blood sugars are higher at lunch but in the 120s before dinner Only once blood sugar was low normal around 7 PM, no hypoglycemia during the day  CGM use % of time   2-week average/GV 125/27  Time in range 90  % Time Above 180 8  % Time above 250   % Time Below 70 2     PRE-MEAL Fasting Lunch Dinner Bedtime Overall  Glucose range:       Averages: 103       POST-MEAL PC Breakfast PC Lunch PC Dinner  Glucose range:     Averages: 152 161 146     Weight control:  Wt Readings from Last 3 Encounters:  01/05/23 300 lb (136.1 kg)  11/18/22 (!) 302 lb 9.6 oz (137.3 kg)  05/14/22 (!) 302 lb 12.8 oz (137.3 kg)            Diabetes labs:  Lab Results  Component Value Date   HGBA1C 11.9 (H) 11/12/2022   HGBA1C 8.5 (A) 03/10/2022   HGBA1C 8.6 (A) 01/06/2022   Lab Results  Component Value Date   MICROALBUR 147.8 (H) 05/07/2022   LDLCALC 107 (H) 11/12/2022   CREATININE 1.74 (H) 11/12/2022     Allergies as of 01/05/2023  Reactions   Keflex [cephalexin] Other (See Comments)   Upset stomach, felt overly tired and felt "crappy"   Oxycodone Rash   ITCHING AND UNABLE TO SLEEP            Medication List        Accurate as of January 05, 2023  3:15 PM. If you have any questions, ask your nurse or doctor.          atorvastatin 10 MG tablet Commonly known as: LIPITOR TAKE 1 TABLET BY MOUTH DAILY   B-D UF III MINI PEN NEEDLES 31G X 5 MM Misc Generic drug: Insulin Pen Needle USE WITH INJECTION FOR LANTUS   bisoprolol 5 MG tablet Commonly known as: ZEBETA Take 5 mg by mouth daily.   Chantix Continuing Month Pak 1 MG tablet Generic drug: varenicline TK 1 T PO BID   dapagliflozin propanediol 10 MG Tabs tablet Commonly known as: Farxiga Take 1 tablet (10 mg total)  by mouth daily before breakfast.   fluticasone furoate-vilanterol 100-25 MCG/INH Aepb Commonly known as: Breo Ellipta Inhale 1 puff into the lungs daily.   FreeStyle Libre 3 Sensor Misc 1 Device by Does not apply route every 14 (fourteen) days. Apply 1 sensor on upper arm every 14 days for continuous glucose monitoring   losartan 50 MG tablet Commonly known as: COZAAR TK 1 T PO QD   Melatonin 10 MG Tabs Take 10 mg by mouth at bedtime.   NovoLOG FlexPen 100 UNIT/ML FlexPen Generic drug: insulin aspart Upto 25 U ac tid   Otezla 30 MG Tabs Generic drug: Apremilast Take 1 tablet by mouth 2 (two) times daily.   Semaglutide (2 MG/DOSE) 8 MG/3ML Sopn Inject 2 mg as directed once a week.   Semglee (yfgn) 100 UNIT/ML Pen Generic drug: insulin glargine-yfgn Inject 60 Units into the skin every morning. And pen needles 1/day   Taclonex external suspension Generic drug: calcipotriene-betamethasone Apply 1 application topically daily. Applies to left shin and right ankle for psoriasis   tadalafil 5 MG tablet Commonly known as: CIALIS TK 1 T PO QD PRN   Tremfya 100 MG/ML Sopn Generic drug: Guselkumab Inject into the skin.   Ventolin HFA 108 (90 Base) MCG/ACT inhaler Generic drug: albuterol INHALE 2 PUFFES EVERY 4-6 HOURS FOR COUGH OR SHORTNESS OF BREATH.        Allergies:  Allergies  Allergen Reactions   Keflex [Cephalexin] Other (See Comments)    Upset stomach, felt overly tired and felt "crappy"   Oxycodone Rash    ITCHING AND UNABLE TO SLEEP        Past Medical History:  Diagnosis Date   Diabetes mellitus without complication (HCC)    Diverticulitis    Hypertension    Plantar fasciitis    Sleep apnea     Past Surgical History:  Procedure Laterality Date   colonscopy     DENTAL SURGERY     FOOT SURGERY      Family History  Problem Relation Age of Onset   Heart disease Father    Colon cancer Father    Bipolar disorder Brother    Diabetes Neg Hx      Social History:  reports that he quit smoking about 6 years ago. His smoking use included cigarettes. He has a 37.50 pack-year smoking history. He has never used smokeless tobacco. He reports current alcohol use. He reports that he does not use drugs.  Review of Systems:  History of erectile dysfunction  Hypertension: He  has been on losartan and bisoprolol for this  BP Readings from Last 3 Encounters:  01/05/23 126/84  11/18/22 122/82  05/14/22 122/84   Lab Results  Component Value Date   CREATININE 1.74 (H) 11/12/2022   CREATININE 1.61 (H) 05/07/2022   CREATININE 1.49 (H) 07/29/2016    Lipids: On Lipitor 10 mg with his LDL only slightly better, has been asked to take his Lipitor regularly    Lab Results  Component Value Date   CHOL 174 11/12/2022   CHOL 190 05/07/2022   Lab Results  Component Value Date   HDL 34.80 (L) 11/12/2022   HDL 33.50 (L) 05/07/2022   Lab Results  Component Value Date   LDLCALC 107 (H) 11/12/2022   LDLCALC 125 (H) 05/07/2022   Lab Results  Component Value Date   TRIG 161.0 (H) 11/12/2022   TRIG 161.0 (H) 05/07/2022   Lab Results  Component Value Date   CHOLHDL 5 11/12/2022   CHOLHDL 6 05/07/2022   No results found for: "LDLDIRECT"   Examination:   BP 126/84   Pulse 63   Ht 6\' 4"  (1.93 m)   Wt 300 lb (136.1 kg)   SpO2 92%   BMI 36.52 kg/m   Body mass index is 36.52 kg/m.    ASSESSMENT/ PLAN:    Diabetes type 2:   Current regimen: GLARGINE INSULIN, OZEMPIC 1 mg, NovoLog 15 units at dinner  His A1c is last higher at 11.9  See history of present illness for detailed discussion of current diabetes management, blood sugar patterns and problems identified  He has a significantly better with restarting Wilder Glade and also adding mealtime insulin at dinnertime Reduce GLARGINE to 55 units Discussed actions of basal insulin and adjustment based on overnight and fasting blood sugar patterns Continue FARXIGA Follow-up with  nutritionist for meal planning and review of his day-to-day management also, likely can improve his diet at lunch and also make more effort to lose weight  Lipids: Will need follow-up in the next visit again especially since both LDL and triglycerides were high in November  Total visit time for evaluation and management and counseling = 30 minutes  Patient Instructions  Semglee 55 units in am  8-10 Novolog at lunch unless small meal   Dennis Sims 01/05/2023, 3:15 PM

## 2023-01-07 ENCOUNTER — Encounter: Payer: Self-pay | Admitting: Endocrinology

## 2023-02-02 DIAGNOSIS — L400 Psoriasis vulgaris: Secondary | ICD-10-CM | POA: Diagnosis not present

## 2023-02-02 DIAGNOSIS — L720 Epidermal cyst: Secondary | ICD-10-CM | POA: Diagnosis not present

## 2023-02-02 DIAGNOSIS — L853 Xerosis cutis: Secondary | ICD-10-CM | POA: Diagnosis not present

## 2023-02-02 DIAGNOSIS — L821 Other seborrheic keratosis: Secondary | ICD-10-CM | POA: Diagnosis not present

## 2023-02-17 DIAGNOSIS — R509 Fever, unspecified: Secondary | ICD-10-CM | POA: Diagnosis not present

## 2023-02-17 DIAGNOSIS — U071 COVID-19: Secondary | ICD-10-CM | POA: Diagnosis not present

## 2023-02-17 DIAGNOSIS — Z20822 Contact with and (suspected) exposure to covid-19: Secondary | ICD-10-CM | POA: Diagnosis not present

## 2023-02-24 ENCOUNTER — Other Ambulatory Visit: Payer: Self-pay | Admitting: Endocrinology

## 2023-02-25 ENCOUNTER — Encounter: Payer: Self-pay | Admitting: Dietician

## 2023-02-25 ENCOUNTER — Encounter: Payer: BC Managed Care – PPO | Attending: Endocrinology | Admitting: Dietician

## 2023-02-25 VITALS — Ht 76.0 in | Wt 295.0 lb

## 2023-02-25 DIAGNOSIS — E1122 Type 2 diabetes mellitus with diabetic chronic kidney disease: Secondary | ICD-10-CM | POA: Insufficient documentation

## 2023-02-25 DIAGNOSIS — Z794 Long term (current) use of insulin: Secondary | ICD-10-CM | POA: Diagnosis not present

## 2023-02-25 DIAGNOSIS — Z713 Dietary counseling and surveillance: Secondary | ICD-10-CM | POA: Insufficient documentation

## 2023-02-25 DIAGNOSIS — E1165 Type 2 diabetes mellitus with hyperglycemia: Secondary | ICD-10-CM | POA: Insufficient documentation

## 2023-02-25 DIAGNOSIS — N183 Chronic kidney disease, stage 3 unspecified: Secondary | ICD-10-CM | POA: Insufficient documentation

## 2023-02-25 DIAGNOSIS — Z6835 Body mass index (BMI) 35.0-35.9, adult: Secondary | ICD-10-CM | POA: Insufficient documentation

## 2023-02-25 DIAGNOSIS — I129 Hypertensive chronic kidney disease with stage 1 through stage 4 chronic kidney disease, or unspecified chronic kidney disease: Secondary | ICD-10-CM | POA: Diagnosis not present

## 2023-02-25 NOTE — Patient Instructions (Signed)
  Aim for 3-4 Carb Choices per meal (45-60 grams) +/- 1 either way  Aim for 0-1 Carbs per snack if hungry  Include protein in moderation with your meals and snacks Consider reading food labels for Total Carbohydrate of foods Consider  increasing your activity level by swimming or water exercises for 30-60 minutes several times per week. Continue using your CGM. Consider taking medication as directed by MD  Mindfulness:  Consistently scheduled meal - avoid skipping  Choices  Eat slowly  Away from distraction (sitting in kitchen or dining room)  Stop eating when satisfied  Before a snack ask, "Am I hungry or eating for another reason?"   "What can I do instead if I am not hungry?"  Try to find something every day that brings you joy!  Great job with changes made!

## 2023-02-25 NOTE — Progress Notes (Signed)
Diabetes Self-Management Education  Visit Type: First/Initial  Appt. Start Time: 1535 Appt. End Time: 1700  02/25/2023  [name], identified by name and date of birth, is a 57 y.o. male with a diagnosis of Diabetes: Type 2.   ASSESSMENT Patient is here today alone.  He was last seen by Vaughan Basta in January for [place] and states that this is life changing. He would like a print out/list that would alow him to lose weight.  He states that he would like a long term plan otherwise he does the plan for a month and then quits.  History includes:  Type 2 Diabetes (2019), OSA (uses c-pap), HTN Medications include:  Thornton Papas, Semglee yfgn 50 units q am, Novolog 15-18 units before dinner  Labs noted to include:  A1C 11.9% 11/12/2022 increased from 8.5% 03/10/2022, GFR 42 11/12/2022 Lipid Panel     Component Value Date/Time   CHOL 174 11/12/2022 0837   TRIG 161.0 (H) 11/12/2022 0837   HDL 34.80 (L) 11/12/2022 0837   CHOLHDL 5 11/12/2022 0837   VLDL 32.2 11/12/2022 0837   LDLCALC 107 (H) 11/12/2022 0837  CGM:  FreeStyle Libre 3  CGM Results from download: 02/25/23  % Time CGM active:   91 %   (Goal >70%)  Average glucose:   6.4% mg/dL for 14 days  Glucose management indicator:   6.4 %  Time in range (70-180 mg/dL):   95 %   (Goal >70%)  Time High (181-250 mg/dL):   5 %   (Goal < 25%)  Time Very High (>250 mg/dL):    0 %   (Goal < 5%)  Time Low (54-69 mg/dL):   0 %   (Goal <4%)  Time Very Low (<54 mg/dL):   0 %   (Goal <1%)   Weight hx: 64" 295 lbs 02/25/2023 302 lbs 11/18/2022  Patient lives with his girlfriend.  She is very supportive and is also trying to lose weight.  They share shopping and cooking.  Rarely drinks. 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 They are planning on joining the aquatic center.  Job related exercise currently.  Back issues related to work.  Height '6\' 4"'$  (1.93 m), weight 295 lb (133.8 kg). Body mass index is 35.91  kg/m.   Diabetes Self-Management Education - 02/25/23 1558       Visit Information   Visit Type First/Initial      Initial Visit   Diabetes Type Type 2    Date Diagnosed 2019    Are you currently following a meal plan? Yes    What type of meal plan do you follow? diabetes    Are you taking your medications as prescribed? Yes      Health Coping   How would you rate your overall health? Fair      Psychosocial Assessment   Patient Belief/Attitude about Diabetes Motivated to manage diabetes    What is the hardest part about your diabetes right now, causing you the most concern, or is the most worrisome to you about your diabetes?   Making healty food and beverage choices    Self-care barriers None    Self-management support Doctor's office    Other persons present Patient    Patient Concerns Nutrition/Meal planning;Weight Control    Special Needs None    Preferred Learning Style No preference indicated    Learning Readiness Ready    How often do you need to have someone  help you when you read instructions, pamphlets, or other written materials from your doctor or pharmacy? 1 - Never    What is the last grade level you completed in school? college grad      Pre-Education Assessment   Patient understands the diabetes disease and treatment process. Needs Review    Patient understands incorporating nutritional management into lifestyle. Needs Review    Patient undertands incorporating physical activity into lifestyle. Needs Review    Patient understands using medications safely. Needs Review    Patient understands monitoring blood glucose, interpreting and using results Needs Review    Patient understands prevention, detection, and treatment of acute complications. Needs Review    Patient understands prevention, detection, and treatment of chronic complications. Needs Review    Patient understands how to develop strategies to address psychosocial issues. Needs Review    Patient  understands how to develop strategies to promote health/change behavior. Needs Review      Complications   Last HgB A1C per patient/outside source 11.9 %   11/12/22 increased from 8.5% 03/10/2022   How often do you check your blood sugar? > 4 times/day    Fasting Blood glucose range (mg/dL) 70-129    Postprandial Blood glucose range (mg/dL) 130-179    Number of hypoglycemic episodes per month 0    Number of hyperglycemic episodes ( >'200mg'$ /dL): Never    Can you tell when your blood sugar is high? Yes    Have you had a dilated eye exam in the past 12 months? Yes    Have you had a dental exam in the past 12 months? Yes    Are you checking your feet? No      Dietary Intake   Breakfast Jimmie Deans frozen sausage biscuit OR Atkin's bar and tea    Snack (morning) none    Lunch hamburger and fries ([place]) OR side salad OR BLT    Snack (afternoon) occasional mixed nuts    Dinner leftover [place] OR protein (steak, hamburger, chicken, fish), vegetable, OR crockpot lasagne OR crockpot chicken and dumplings OR split pea soup, bread and butter    Snack (evening) adkin's bar or peanuts or leftovers    Beverage(s) water, zero coke, zero gatorade, 2% milk, hot tea (splenda, powdered cream), unsweetened tea      Activity / Exercise   Activity / Exercise Type Light (walking / raking leaves)   job related   How many days per week do you exercise? 5    How many minutes per day do you exercise? 60    Total minutes per week of exercise 300      Patient Education   Previous Diabetes Education Yes (please comment)   2019   Disease Pathophysiology Definition of diabetes, type 1 and 2, and the diagnosis of diabetes;Other (comment)   insulin resistance   Healthy Eating Plate Method;Food label reading, portion sizes and measuring food.;Carbohydrate counting;Role of diet in the treatment of diabetes and the relationship between the three main macronutrients and blood glucose level;Meal options for control of  blood glucose level and chronic complications.    Being Active Role of exercise on diabetes management, blood pressure control and cardiac health.;Helped patient identify appropriate exercises in relation to his/her diabetes, diabetes complications and other health issue.    Medications Reviewed patients medication for diabetes, action, purpose, timing of dose and side effects.    Monitoring Identified appropriate SMBG and/or A1C goals.;Taught/discussed recording of test results and interpretation of SMBG.;Daily foot exams;Yearly dilated  eye exam;Taught/evaluated CGM (comment)    Acute complications Taught prevention, symptoms, and  treatment of hypoglycemia - the 15 rule.;Discussed and identified patients' prevention, symptoms, and treatment of hyperglycemia.    Chronic complications Relationship between chronic complications and blood glucose control;Retinopathy and reason for yearly dilated eye exams;Identified and discussed with patient  current chronic complications;Lipid levels, blood glucose control and heart disease    Diabetes Stress and Support Identified and addressed patients feelings and concerns about diabetes;Worked with patient to identify barriers to care and solutions;Role of stress on diabetes      Individualized Goals (developed by patient)   Nutrition General guidelines for healthy choices and portions discussed    Physical Activity Exercise 3-5 times per week;60 minutes per day    Medications take my medication as prescribed    Monitoring  Consistenly use CGM    Problem Solving Eating Pattern    Reducing Risk treat hypoglycemia with 15 grams of carbs if blood glucose less than '70mg'$ /dL;do foot checks daily;examine blood glucose patterns      Post-Education Assessment   Patient understands the diabetes disease and treatment process. Demonstrates understanding / competency    Patient understands incorporating nutritional management into lifestyle. Comprehends key points     Patient undertands incorporating physical activity into lifestyle. Demonstrates understanding / competency    Patient understands using medications safely. Demonstrates understanding / competency    Patient understands monitoring blood glucose, interpreting and using results Demonstrates understanding / competency    Patient understands prevention, detection, and treatment of acute complications. Demonstrates understanding / competency    Patient understands prevention, detection, and treatment of chronic complications. Demonstrates understanding / competency    Patient understands how to develop strategies to address psychosocial issues. Demonstrates understanding / competency    Patient understands how to develop strategies to promote health/change behavior. Comprehends key points      Outcomes   Future DMSE 3-4 months    Program Status Not Completed             Individualized Plan for Diabetes Self-Management Training:   Learning Objective:  Patient will have a greater understanding of diabetes self-management. Patient education plan is to attend individual and/or group sessions per assessed needs and concerns.   Plan:   Patient Instructions   Aim for 3-4 Carb Choices per meal (45-60 grams) +/- 1 either way  Aim for 0-1 Carbs per snack if hungry  Include protein in moderation with your meals and snacks Consider reading food labels for Total Carbohydrate of foods Consider  increasing your activity level by swimming or water exercises for 30-60 minutes several times per week. Continue using your CGM. Consider taking medication as directed by MD  Mindfulness:  Consistently scheduled meal - avoid skipping  Choices  Eat slowly  Away from distraction (sitting in kitchen or dining room)  Stop eating when satisfied  Before a snack ask, "Am I hungry or eating for another reason?"   "What can I do instead if I am not hungry?"  Try to find something every day that brings you  joy!  Great job with changes made!    Expected Outcomes:     Education material provided: ADA - How to Thrive: A Guide for Your Journey with Diabetes, Food label handouts, Meal plan card, and Diabetes Resources, 1800 calorie 5 day meal plan from AND, Heart Healthy Carbohydrate Consistent Nutrition Therapy from AND  If problems or questions, patient to contact team via:  Phone  Future DSME appointment:  3-4 months

## 2023-03-08 DIAGNOSIS — E1122 Type 2 diabetes mellitus with diabetic chronic kidney disease: Secondary | ICD-10-CM | POA: Diagnosis not present

## 2023-03-08 DIAGNOSIS — E782 Mixed hyperlipidemia: Secondary | ICD-10-CM | POA: Diagnosis not present

## 2023-03-08 DIAGNOSIS — I129 Hypertensive chronic kidney disease with stage 1 through stage 4 chronic kidney disease, or unspecified chronic kidney disease: Secondary | ICD-10-CM | POA: Diagnosis not present

## 2023-03-22 ENCOUNTER — Other Ambulatory Visit: Payer: Self-pay | Admitting: Endocrinology

## 2023-03-29 ENCOUNTER — Other Ambulatory Visit (INDEPENDENT_AMBULATORY_CARE_PROVIDER_SITE_OTHER): Payer: BC Managed Care – PPO

## 2023-03-29 DIAGNOSIS — E7800 Pure hypercholesterolemia, unspecified: Secondary | ICD-10-CM

## 2023-03-29 DIAGNOSIS — E1165 Type 2 diabetes mellitus with hyperglycemia: Secondary | ICD-10-CM

## 2023-03-29 DIAGNOSIS — Z794 Long term (current) use of insulin: Secondary | ICD-10-CM

## 2023-03-29 LAB — BASIC METABOLIC PANEL
BUN: 25 mg/dL — ABNORMAL HIGH (ref 6–23)
CO2: 28 mEq/L (ref 19–32)
Calcium: 9.6 mg/dL (ref 8.4–10.5)
Chloride: 104 mEq/L (ref 96–112)
Creatinine, Ser: 1.63 mg/dL — ABNORMAL HIGH (ref 0.40–1.50)
GFR: 46.06 mL/min — ABNORMAL LOW (ref >60.00)
Glucose, Bld: 121 mg/dL — ABNORMAL HIGH (ref 70–99)
Potassium: 4.3 mEq/L (ref 3.5–5.1)
Sodium: 141 mEq/L (ref 135–145)

## 2023-03-29 LAB — LIPID PANEL
Cholesterol: 162 mg/dL (ref 0–200)
HDL: 36.1 mg/dL — ABNORMAL LOW (ref >39.00)
LDL Cholesterol: 106 mg/dL — ABNORMAL HIGH (ref 0–99)
NonHDL: 125.96
Total CHOL/HDL Ratio: 4
Triglycerides: 100 mg/dL (ref 0.0–149.0)
VLDL: 20 mg/dL (ref 0.0–40.0)

## 2023-03-29 LAB — HEMOGLOBIN A1C: Hgb A1c MFr Bld: 6.5 % (ref 4.6–6.5)

## 2023-04-07 ENCOUNTER — Encounter: Payer: Self-pay | Admitting: Endocrinology

## 2023-04-07 ENCOUNTER — Ambulatory Visit (INDEPENDENT_AMBULATORY_CARE_PROVIDER_SITE_OTHER): Payer: BC Managed Care – PPO | Admitting: Endocrinology

## 2023-04-07 VITALS — BP 140/82 | HR 61 | Ht 76.0 in | Wt 290.6 lb

## 2023-04-07 DIAGNOSIS — Z7984 Long term (current) use of oral hypoglycemic drugs: Secondary | ICD-10-CM

## 2023-04-07 DIAGNOSIS — E1122 Type 2 diabetes mellitus with diabetic chronic kidney disease: Secondary | ICD-10-CM

## 2023-04-07 DIAGNOSIS — N183 Chronic kidney disease, stage 3 unspecified: Secondary | ICD-10-CM

## 2023-04-07 DIAGNOSIS — Z794 Long term (current) use of insulin: Secondary | ICD-10-CM | POA: Diagnosis not present

## 2023-04-07 DIAGNOSIS — I129 Hypertensive chronic kidney disease with stage 1 through stage 4 chronic kidney disease, or unspecified chronic kidney disease: Secondary | ICD-10-CM

## 2023-04-07 DIAGNOSIS — E7800 Pure hypercholesterolemia, unspecified: Secondary | ICD-10-CM | POA: Diagnosis not present

## 2023-04-07 DIAGNOSIS — E1165 Type 2 diabetes mellitus with hyperglycemia: Secondary | ICD-10-CM | POA: Diagnosis not present

## 2023-04-07 NOTE — Progress Notes (Signed)
Patient ID: HJALMER IOVINO, male   DOB: 12/12/1966, 57 y.o.   MRN: [id number]           Reason for Appointment: Type II Diabetes follow-up   History of Present Illness   Diagnosis date: 2019  Previous history:  He has been on various medications inadequate control with range of A1c since 2022 has been 8.6 and 13.8 Ozempic was started in 9/22 Marcelline Deist was started in 12/2021 BASAL insulin was started in 2020   Recent history:      Non-insulin hypoglycemic drugs: Ozempic 1 mg weekly,      Insulin regimen: Semglee 40 units daily in the morning, NovoLog 15-18 units before dinner           Side effects from medications: None  Current self management, blood sugar patterns and problems identified:  A1c is 6.5 vs 11.9   He has been working with the dietitian and with making lifestyle changes and diet is able to control his sugars much better He says he is finding the freestyle libre very useful in helping him modify his diet as well as adjust his insulin He was having tendency to lower sugars overnight and he has progressively reduced his insulin dose by a total of 20 units since his last visit in January Also the last 5 or 6 days he has cut down the insulin and another 5 minutes However review of his sensor readings at he has for the last 2 weeks shows that his low sugar episode was just over midnight possibly from different meal intake the night before  His fasting readings are in the low 100 range Postprandial readings are consistently averaging below 140 although periodically will have readings above 180 especially at dinnertime  He is usually taking his NovoLog right at mealtimes  Has lost 10 pounds Blood sugars are also better after breakfast with stopping cereal  Exercise: Nothing formal, is physically active at work  [employer] as follows  Overnight blood sugars are generally fairly stable in the low 100 range and are about the same till about 10 AM;  had 1 episode of low sugar after 12 midnight on April 19 which is mild and transient and also on 4/12 Premeal readings at lunch and dinner are about 120 average No hypoglycemia during the day Postprandial readings after lunch are sporadically higher above 180 and also after dinner Highest readings are about 9 PM at night   CGM use % of time   2-week average/GV 123/21  Time in range 94  % Time Above 180 4  % Time above 250   % Time Below 70 2     PRE-MEAL Fasting Lunch Dinner Bedtime Overall  Glucose range:       Averages: 115       POST-MEAL PC Breakfast PC Lunch PC Dinner  Glucose range:     Averages: 125 142 140   Prior   CGM use % of time   2-week average/GV 125/27  Time in range 90  % Time Above 180 8  % Time above 250   % Time Below 70 2     PRE-MEAL Fasting Lunch Dinner Bedtime Overall  Glucose range:       Averages: 103       POST-MEAL PC Breakfast PC Lunch PC Dinner  Glucose range:     Averages: 152 161 146     Weight control:  Wt Readings from Last 3 Encounters:  04/07/23 290 lb  9.6 oz (131.8 kg)  02/25/23 295 lb (133.8 kg)  01/05/23 300 lb (136.1 kg)            Diabetes labs:  Lab Results  Component Value Date   HGBA1C 6.5 03/29/2023   HGBA1C 11.9 (H) 11/12/2022   HGBA1C 8.5 (A) 03/10/2022   Lab Results  Component Value Date   MICROALBUR 147.8 (H) 05/07/2022   LDLCALC 106 (H) 03/29/2023   CREATININE 1.63 (H) 03/29/2023     Allergies as of 04/07/2023       Reactions   Keflex [cephalexin] Other (See Comments)   Upset stomach, felt overly tired and felt "crappy"   Oxycodone Rash   ITCHING AND UNABLE TO SLEEP            Medication List        Accurate as of April 07, 2023 11:22 AM. If you have any questions, ask your nurse or doctor.          atorvastatin 10 MG tablet Commonly known as: LIPITOR TAKE 1 TABLET BY MOUTH DAILY   B-D UF III MINI PEN NEEDLES 31G X 5 MM Misc Generic drug: Insulin Pen Needle USE TO INJECT  ONCE DAILY   bisoprolol 5 MG tablet Commonly known as: ZEBETA Take 5 mg by mouth daily.   Chantix Continuing Month Pak 1 MG tablet Generic drug: varenicline TK 1 T PO BID   dapagliflozin propanediol 10 MG Tabs tablet Commonly known as: Farxiga Take 1 tablet (10 mg total) by mouth daily before breakfast.   fluticasone furoate-vilanterol 100-25 MCG/INH Aepb Commonly known as: Breo Ellipta Inhale 1 puff into the lungs daily.   FreeStyle Libre 3 Sensor Misc APPLY 1 SENSOR ON UPPER ARM EVERY 14 DAYS   losartan 50 MG tablet Commonly known as: COZAAR TK 1 T PO QD   Melatonin 10 MG Tabs Take 10 mg by mouth at bedtime.   multivitamin with minerals Tabs tablet Take 1 tablet by mouth daily.   NovoLOG FlexPen 100 UNIT/ML FlexPen Generic drug: insulin aspart Upto 25 U ac tid   Otezla 30 MG Tabs Generic drug: Apremilast Take 1 tablet by mouth 2 (two) times daily.   Semaglutide (2 MG/DOSE) 8 MG/3ML Sopn Inject 2 mg as directed once a week.   Semglee (yfgn) 100 UNIT/ML Pen Generic drug: insulin glargine-yfgn Inject 60 Units into the skin every morning. And pen needles 1/day   Taclonex external suspension Generic drug: calcipotriene-betamethasone Apply 1 application topically daily. Applies to left shin and right ankle for psoriasis   tadalafil 5 MG tablet Commonly known as: CIALIS TK 1 T PO QD PRN   Tremfya 100 MG/ML Sopn Generic drug: Guselkumab Inject into the skin.   Ventolin HFA 108 (90 Base) MCG/ACT inhaler Generic drug: albuterol INHALE 2 PUFFES EVERY 4-6 HOURS FOR COUGH OR SHORTNESS OF BREATH.        Allergies:  Allergies  Allergen Reactions   Keflex [Cephalexin] Other (See Comments)    Upset stomach, felt overly tired and felt "crappy"   Oxycodone Rash    ITCHING AND UNABLE TO SLEEP        Past Medical History:  Diagnosis Date   Diabetes mellitus without complication    Diverticulitis    Hypertension    Plantar fasciitis    Sleep apnea      Past Surgical History:  Procedure Laterality Date   colonscopy     DENTAL SURGERY     FOOT SURGERY      Family  History  Problem Relation Age of Onset   Heart disease Father    Colon cancer Father    Bipolar disorder Brother    Diabetes Neg Hx     Social History:  reports that he quit smoking about 6 years ago. His smoking use included cigarettes. He has a 37.50 pack-year smoking history. He has never used smokeless tobacco. He reports current alcohol use. He reports that he does not use drugs.  Review of Systems:  History of erectile dysfunction  Hypertension: He has been on losartan and bisoprolol for treatment  BP Readings from Last 3 Encounters:  04/07/23 (!) 140/82  01/05/23 126/84  11/18/22 122/82   He has chronic kidney disease, stable Taking [place]  Lab Results  Component Value Date   CREATININE 1.63 (H) 03/29/2023   CREATININE 1.74 (H) 11/12/2022   CREATININE 1.61 (H) 05/07/2022    Lipids: On Lipitor 10 mg with his LDL still above 100 but he ran out of his atorvastatin at least 2 weeks prior to his labs Overall diet has been better triglycerides are normal    Lab Results  Component Value Date   CHOL 162 03/29/2023   CHOL 174 11/12/2022   CHOL 190 05/07/2022   Lab Results  Component Value Date   HDL 36.10 (L) 03/29/2023   HDL 34.80 (L) 11/12/2022   HDL 33.50 (L) 05/07/2022   Lab Results  Component Value Date   LDLCALC 106 (H) 03/29/2023   LDLCALC 107 (H) 11/12/2022   LDLCALC 125 (H) 05/07/2022   Lab Results  Component Value Date   TRIG 100.0 03/29/2023   TRIG 161.0 (H) 11/12/2022   TRIG 161.0 (H) 05/07/2022   Lab Results  Component Value Date   CHOLHDL 4 03/29/2023   CHOLHDL 5 11/12/2022   CHOLHDL 6 05/07/2022   No results found for: "LDLDIRECT"   Examination:   BP (!) 140/82 (BP Location: Left Arm, Patient Position: Sitting, Cuff Size: Normal)   Pulse 61   Ht  (1.93 m)   Wt 290 lb 9.6 oz (131.8 kg)   SpO2 98%   BMI  35.37 kg/m   Body mass index is 35.37 kg/m.    ASSESSMENT/ PLAN:    Diabetes type 2:   Current regimen: GLARGINE INSULIN, OZEMPIC 1 mg, Farxiga 10 mg daily NovoLog 15 units at dinner  His A1c is at his best level of 6.5  See history of present illness for detailed discussion of current diabetes management, blood sugar patterns and problems identified  He has a significantly level of control with modification of his diet, being able to monitor his sugars closely with the [place] sensor as well as continued use of Ozempic and [place]  He will reduce his glargine only if his overnight readings are below 70 Also make sure he is taking his NovoLog before starting to eat especially planning to have a higher carbohydrate meal May also reduce the NovoLog to 12 units if eating smaller low carbohydrate meal at dinnertime  Lipids: Needs to take atorvastatin regularly Will need follow-up in the next visit again, discussed need for getting LDL below 100 and continued use of statins  CKD: Stable, needs follow-up of microalbumin  Total visit time for evaluation and management and counseling = 30 minutes  There are no Patient Instructions on file for this visit.   Reather Littler 04/07/2023, 11:22 AM

## 2023-04-07 NOTE — Patient Instructions (Signed)
Check blood sugars on waking up   Also check blood sugars about 2 hours after meals and do this after different meals by rotation  Recommended blood sugar levels on waking up are 90-130 and about 2 hours after meal is 130-160  Please bring your blood sugar monitor to each visit, thank you  

## 2023-04-09 ENCOUNTER — Encounter: Payer: Self-pay | Admitting: Endocrinology

## 2023-04-27 ENCOUNTER — Ambulatory Visit (INDEPENDENT_AMBULATORY_CARE_PROVIDER_SITE_OTHER): Payer: BC Managed Care – PPO | Admitting: Podiatry

## 2023-04-27 DIAGNOSIS — L600 Ingrowing nail: Secondary | ICD-10-CM | POA: Diagnosis not present

## 2023-04-27 MED ORDER — NEOMYCIN-POLYMYXIN-HC 1 % OT SOLN: OTIC | 1 refills | Status: AC

## 2023-04-27 NOTE — Patient Instructions (Signed)

## 2023-04-28 NOTE — Progress Notes (Signed)
He presents today chief complaint of ingrown toenail to the hallux left along the medial border.  States that has had some erythema but no purulence and no bleeding.  Objective: Vital signs stable he is alert oriented x 3.  Hallux left demonstrate sharp incurvated nail margin along the tibial and fibular border.  Assessment: Ingrown nail tibial fibular border of the hallux left.  Plan: Chemical matricectomy was performed today after local anesthetic was administered tolerated procedure well without complications.  Was given both oral and home-going instruction for care and soaking of the foot and I will follow-up with him in 2 weeks.  He was also provided prescription of Cortisporin Otic to be applied twice daily after soaking.

## 2023-05-04 ENCOUNTER — Ambulatory Visit: Payer: BC Managed Care – PPO | Admitting: Podiatry

## 2023-05-04 ENCOUNTER — Encounter: Payer: Self-pay | Admitting: Podiatry

## 2023-05-04 DIAGNOSIS — N1831 Chronic kidney disease, stage 3a: Secondary | ICD-10-CM | POA: Insufficient documentation

## 2023-05-04 DIAGNOSIS — L600 Ingrowing nail: Secondary | ICD-10-CM

## 2023-05-04 DIAGNOSIS — Z9889 Other specified postprocedural states: Secondary | ICD-10-CM

## 2023-05-05 NOTE — Progress Notes (Signed)
He presents today for follow-up of his matrixectomy hallux left.  He says like this is doing good as he refers to the tibial-fibular fibular border matrixectomy's.  Objective: Vital signs stable alert oriented x 3 minimal edema minimal erythema tenderness is still palpable.  Assessment: Well-healing matrixectomy is still tender.  Plan: Recommend he continue to soak Epsom salts and warm water every day to every other day cover during the daytime but leave open at bedtime.  Follow-up with me should this become more painful or more red.

## 2023-06-04 ENCOUNTER — Encounter: Payer: BC Managed Care – PPO | Attending: Endocrinology | Admitting: Dietician

## 2023-06-04 ENCOUNTER — Encounter: Payer: Self-pay | Admitting: Dietician

## 2023-06-04 DIAGNOSIS — E1122 Type 2 diabetes mellitus with diabetic chronic kidney disease: Secondary | ICD-10-CM | POA: Diagnosis not present

## 2023-06-04 DIAGNOSIS — N183 Chronic kidney disease, stage 3 unspecified: Secondary | ICD-10-CM | POA: Insufficient documentation

## 2023-06-04 DIAGNOSIS — I129 Hypertensive chronic kidney disease with stage 1 through stage 4 chronic kidney disease, or unspecified chronic kidney disease: Secondary | ICD-10-CM | POA: Insufficient documentation

## 2023-06-04 NOTE — Progress Notes (Signed)
Diabetes Self-Management Education  Visit Type: Follow-up  Appt. Start Time: 1625 Appt. End Time: 1550  06/04/2023  [name], identified by name and date of birth, is a 57 y.o. male with a diagnosis of Diabetes:  .   ASSESSMENT Patient is here today alone.  He was last seen by this RD on 02/25/2023.  Stopped biscuits, Little [employer]bbie, and [employer], stopped increased portion of nuts. He was following low carb last time and now has decreased saturated fat. Has not increased exercise except for work. Weight loss of 20 lbs in the past 3 months. A1C decreased significantly.  He has decreased his Semglee due to occasional low blood glucose.   History includes:  Type 2 Diabetes (2019), OSA (uses c-pap), HTN Medications include:  Marcelline Deist, Semaglutide, Semglee yfgn 30 units q am, Novolog 15 units before dinner  Labs noted to include:  A1C 6.5% 03/29/2023 decreased from 11.9% 11/12/2022 increased from 8.5% 03/10/2022, GFR 42 11/12/2022 Lipid Panel          Component Value Date/Time    CHOL 174 11/12/2022 0837    TRIG 161.0 (H) 11/12/2022 0837    HDL 34.80 (L) 11/12/2022 0837    CHOLHDL 5 11/12/2022 0837    VLDL 32.2 11/12/2022 0837    LDLCALC 107 (H) 11/12/2022 0837  CGM:  FreeStyle Libre 3   CGM Results from download: 02/25/23 06/04/23  % Time CGM active:   91 %   (Goal >70%) 67  Average glucose:    mg/dL for 14 days 161 x7 days  Glucose management indicator:   6.4 % 6.4%  Time in range (70-180 mg/dL):   95 %   (Goal >09%) 97  Time High (181-250 mg/dL):   5 %   (Goal < 60%) 3  Time Very High (>250 mg/dL):    0 %   (Goal < 5%) 0  Time Low (54-69 mg/dL):   0 %   (Goal <4%) 0  Time Very Low (<54 mg/dL):   0 %   (Goal <5%) 0    Weight hx: 64" 275 lbs 06/04/2023 295 lbs 02/25/2023 302 lbs 11/18/2022   Patient lives with his girlfriend.  She is very supportive and is also trying to lose weight.  They share shopping and cooking.  Rarely drinks. 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  Job related exercise currently.  Back issues related to work.  There were no vitals taken for this visit. There is no height or weight on file to calculate BMI.   Diabetes Self-Management Education - 06/04/23 1701       Visit Information   Visit Type Follow-up      Pre-Education Assessment   Patient understands the diabetes disease and treatment process. Comprehends key points    Patient understands incorporating nutritional management into lifestyle. Comprehends key points    Patient undertands incorporating physical activity into lifestyle. Comprehends key points    Patient understands using medications safely. Comprehends key points    Patient understands monitoring blood glucose, interpreting and using results Comprehends key points    Patient understands prevention, detection, and treatment of acute complications. Comprehends key points    Patient understands prevention, detection, and treatment of chronic complications. Compreheands key points    Patient understands how to develop strategies to address psychosocial issues. Comprehends key points    Patient understands how to develop strategies to promote health/change behavior. Comprehends  key points      Complications   Last HgB A1C per patient/outside source 6.5 %   03/29/23   How often do you check your blood sugar? > 4 times/day    Fasting Blood glucose range (mg/dL) 16-109    Postprandial Blood glucose range (mg/dL) 604-540;98-119    Number of hypoglycemic episodes per month 0      Dietary Intake   Breakfast Adkin's bar, hot tea with splenda and SF coffeemate creamer    Lunch BLT    Snack (afternoon) occasional    Dinner hamburger on a bun, 2 bites key lime pie OR boiled shrimp or salmon, vegetables    Snack (evening) ocaasional adkin's bar    Beverage(s) water, zero soda, zero gatorade, 2% milk, hot tea (spenda and powdered creamer),  unsweetened tea      Activity / Exercise   Activity / Exercise Type Light (walking / raking leaves)   work related     Patient Education   Previous Diabetes Education Yes (please comment)   02/2023   Healthy Eating Other (comment)   current meal changes   Being Active Role of exercise on diabetes management, blood pressure control and cardiac health.;Helped patient identify appropriate exercises in relation to his/her diabetes, diabetes complications and other health issue.    Medications Reviewed patients medication for diabetes, action, purpose, timing of dose and side effects.    Monitoring Taught/evaluated CGM (comment)    Diabetes Stress and Support Identified and addressed patients feelings and concerns about diabetes;Worked with patient to identify barriers to care and solutions      Individualized Goals (developed by patient)   Nutrition General guidelines for healthy choices and portions discussed    Physical Activity Exercise 3-5 times per week;30 minutes per day    Medications take my medication as prescribed    Monitoring  Consistenly use CGM    Problem Solving Eating Pattern    Reducing Risk treat hypoglycemia with 15 grams of carbs if blood glucose less than 70mg /dL;examine blood glucose patterns      Patient Self-Evaluation of Goals - Patient rates self as meeting previously set goals (% of time)   Nutrition 50 - 75 % (half of the time)    Physical Activity 25 - 50% (sometimes)    Medications >75% (most of the time)    Monitoring >75% (most of the time)    Problem Solving and behavior change strategies  >75% (most of the time)    Reducing Risk (treating acute and chronic complications) 50 - 75 % (half of the time)    Health Coping 50 - 75 % (half of the time)      Post-Education Assessment   Patient understands the diabetes disease and treatment process. Demonstrates understanding / competency    Patient understands incorporating nutritional management into lifestyle.  Comprehends key points    Patient undertands incorporating physical activity into lifestyle. Demonstrates understanding / competency    Patient understands using medications safely. Demonstrates understanding / competency    Patient understands monitoring blood glucose, interpreting and using results Demonstrates understanding / competency    Patient understands prevention, detection, and treatment of acute complications. Demonstrates understanding / competency    Patient understands prevention, detection, and treatment of chronic complications. Demonstrates understanding / competency    Patient understands how to develop strategies to address psychosocial issues. Demonstrates understanding / competency    Patient understands how to develop strategies to promote health/change behavior. Comprehends key points  Outcomes   Expected Outcomes Demonstrated interest in learning. Expect positive outcomes    Future DMSE PRN    Program Status Completed      Subsequent Visit   Since your last visit have you experienced any weight changes? Loss    Weight Loss (lbs) 20             Individualized Plan for Diabetes Self-Management Training:   Learning Objective:  Patient will have a greater understanding of diabetes self-management. Patient education plan is to attend individual and/or group sessions per assessed needs and concerns.   Plan:   Patient Instructions  Great job on changes mae!  Consider swimming.  Please call if you have any questions.  Expected Outcomes:  Demonstrated interest in learning. Expect positive outcomes  Education material provided:   If problems or questions, patient to contact team via:  Phone  Future DSME appointment: PRN

## 2023-06-04 NOTE — Patient Instructions (Signed)
Great job on changes mae!  Consider swimming.  Please call if you have any questions.

## 2023-06-11 ENCOUNTER — Other Ambulatory Visit: Payer: Self-pay | Admitting: Endocrinology

## 2023-06-11 DIAGNOSIS — E1165 Type 2 diabetes mellitus with hyperglycemia: Secondary | ICD-10-CM

## 2023-06-14 ENCOUNTER — Other Ambulatory Visit: Payer: Self-pay

## 2023-06-14 DIAGNOSIS — Z794 Long term (current) use of insulin: Secondary | ICD-10-CM

## 2023-06-14 MED ORDER — NOVOLOG FLEXPEN 100 UNIT/ML ~~LOC~~ SOPN
PEN_INJECTOR | SUBCUTANEOUS | 5 refills | Status: DC
Start: 2023-06-14 — End: 2023-11-01

## 2023-06-14 MED ORDER — BD PEN NEEDLE MINI U/F 31G X 5 MM MISC
3 refills | Status: DC
Start: 2023-06-14 — End: 2024-05-23

## 2023-06-16 ENCOUNTER — Other Ambulatory Visit: Payer: Self-pay | Admitting: Endocrinology

## 2023-06-16 ENCOUNTER — Other Ambulatory Visit: Payer: Self-pay

## 2023-06-16 DIAGNOSIS — E1165 Type 2 diabetes mellitus with hyperglycemia: Secondary | ICD-10-CM

## 2023-06-16 MED ORDER — SEMGLEE (YFGN) 100 UNIT/ML ~~LOC~~ SOPN
60.0000 [IU] | PEN_INJECTOR | SUBCUTANEOUS | 2 refills | Status: DC
Start: 2023-06-16 — End: 2023-11-01

## 2023-06-20 ENCOUNTER — Other Ambulatory Visit: Payer: Self-pay | Admitting: Endocrinology

## 2023-07-05 ENCOUNTER — Other Ambulatory Visit (INDEPENDENT_AMBULATORY_CARE_PROVIDER_SITE_OTHER): Payer: BC Managed Care – PPO

## 2023-07-05 DIAGNOSIS — E1165 Type 2 diabetes mellitus with hyperglycemia: Secondary | ICD-10-CM

## 2023-07-05 DIAGNOSIS — I129 Hypertensive chronic kidney disease with stage 1 through stage 4 chronic kidney disease, or unspecified chronic kidney disease: Secondary | ICD-10-CM

## 2023-07-05 DIAGNOSIS — E1122 Type 2 diabetes mellitus with diabetic chronic kidney disease: Secondary | ICD-10-CM

## 2023-07-05 DIAGNOSIS — E7800 Pure hypercholesterolemia, unspecified: Secondary | ICD-10-CM | POA: Diagnosis not present

## 2023-07-05 DIAGNOSIS — Z794 Long term (current) use of insulin: Secondary | ICD-10-CM

## 2023-07-05 DIAGNOSIS — N183 Chronic kidney disease, stage 3 unspecified: Secondary | ICD-10-CM

## 2023-07-05 LAB — MICROALBUMIN / CREATININE URINE RATIO
Creatinine,U: 92.8 mg/dL
Microalb Creat Ratio: 34.2 mg/g — ABNORMAL HIGH (ref 0.0–30.0)
Microalb, Ur: 31.8 mg/dL — ABNORMAL HIGH (ref 0.0–1.9)

## 2023-07-05 LAB — LIPID PANEL
Cholesterol: 152 mg/dL (ref 0–200)
HDL: 37.1 mg/dL — ABNORMAL LOW (ref >39.00)
LDL Cholesterol: 95 mg/dL (ref 0–99)
NonHDL: 114.54
Total CHOL/HDL Ratio: 4
Triglycerides: 97 mg/dL (ref 0.0–149.0)
VLDL: 19.4 mg/dL (ref 0.0–40.0)

## 2023-07-05 LAB — COMPREHENSIVE METABOLIC PANEL
ALT: 22 U/L (ref 0–53)
AST: 17 U/L (ref 0–37)
Albumin: 4.4 g/dL (ref 3.5–5.2)
Alkaline Phosphatase: 56 U/L (ref 39–117)
BUN: 31 mg/dL — ABNORMAL HIGH (ref 6–23)
CO2: 24 mEq/L (ref 19–32)
Calcium: 9.7 mg/dL (ref 8.4–10.5)
Chloride: 104 mEq/L (ref 96–112)
Creatinine, Ser: 1.41 mg/dL (ref 0.40–1.50)
GFR: 54.71 mL/min — ABNORMAL LOW (ref >60.00)
Glucose, Bld: 139 mg/dL — ABNORMAL HIGH (ref 70–99)
Potassium: 4.1 mEq/L (ref 3.5–5.1)
Sodium: 138 mEq/L (ref 135–145)
Total Bilirubin: 1.3 mg/dL — ABNORMAL HIGH (ref 0.2–1.2)
Total Protein: 7.4 g/dL (ref 6.0–8.3)

## 2023-07-05 LAB — HEMOGLOBIN A1C: Hgb A1c MFr Bld: 6.1 % (ref 4.6–6.5)

## 2023-07-08 ENCOUNTER — Encounter: Payer: Self-pay | Admitting: Endocrinology

## 2023-07-08 ENCOUNTER — Ambulatory Visit: Payer: BC Managed Care – PPO | Admitting: Endocrinology

## 2023-07-08 VITALS — BP 124/76 | HR 62 | Ht 76.0 in | Wt 279.0 lb

## 2023-07-08 DIAGNOSIS — E1165 Type 2 diabetes mellitus with hyperglycemia: Secondary | ICD-10-CM | POA: Diagnosis not present

## 2023-07-08 DIAGNOSIS — E7800 Pure hypercholesterolemia, unspecified: Secondary | ICD-10-CM | POA: Diagnosis not present

## 2023-07-08 DIAGNOSIS — E1122 Type 2 diabetes mellitus with diabetic chronic kidney disease: Secondary | ICD-10-CM | POA: Diagnosis not present

## 2023-07-08 DIAGNOSIS — Z794 Long term (current) use of insulin: Secondary | ICD-10-CM | POA: Diagnosis not present

## 2023-07-08 DIAGNOSIS — N183 Chronic kidney disease, stage 3 unspecified: Secondary | ICD-10-CM

## 2023-07-08 DIAGNOSIS — I129 Hypertensive chronic kidney disease with stage 1 through stage 4 chronic kidney disease, or unspecified chronic kidney disease: Secondary | ICD-10-CM

## 2023-07-08 NOTE — Progress Notes (Signed)
Patient ID: Dennis Sims, male   DOB: [birth year], 57 y.o.   MRN: [id number]           Reason for Appointment: Type II Diabetes follow-up   History of Present Illness   Diagnosis date: 2019  Previous history:  He has been on various medications inadequate control with range of A1c since 2022 has been 8.6 and 13.8 Ozempic was started in 9/22 Marcelline Deist was started in 12/2021 BASAL insulin was started in 2020   Recent history:      Non-insulin hypoglycemic drugs: Ozempic 2 mg weekly,      Insulin regimen: Semglee 30 units daily in the morning, NovoLog 15 units before dinner           Side effects from medications: None  Current self management, blood sugar patterns and problems identified:  A1c is 6.1  He has been doing better with 2 mg Ozempic with further improvement in his A1c and weight Has also been generally following instructions given by the dietitian, including getting balanced meals at breakfast with some protein He is relatively active at work but not doing much formal exercise Mostly taking NovoLog at dinnertime and not adjusting it much based on what he is eating and this is usually fairly effective with only minimal hyperglycemia after dinner and no low sugars Occasionally with larger meals at lunch he will have a mild increase in blood sugar over 180 or 200  Usually overnight blood sugars are consistent with continuing 30 units of insulin, previously had taken 40 or more As before he is benefiting from using the freestyle libre sensor for watching his blood sugar patterns especially after meals  Exercise: Nothing formal, is physically active at work  [employer] over the last 2 weeks as follows  Overnight blood sugars are well-controlled ranging from 118 up to 131 average on the 2-hour analysis Blood sugars the rest of the day are also consistently averaging within the target range He has averages as low as 128 Premeal at  dinnertime Postprandial readings are mostly within the target range at all times Occasionally will have a short spikes postprandially after lunch or rarely evening over 180 but usually under 200 No hypoglycemia   CGM use % of time 96  2-week average/GV 137/19  Time in range        % 94  % Time Above 180 6  % Time above 250   % Time Below 70      PRE-MEAL Fasting Lunch Dinner Bedtime Overall  Glucose range:       Averages: 131       POST-MEAL PC Breakfast PC Lunch PC Dinner  Glucose range:     Averages: 144 157 150   PREVIOUSLY  CGM use % of time   2-week average/GV 123/21  Time in range 94  % Time Above 180 4  % Time above 250   % Time Below 70 2     PRE-MEAL Fasting Lunch Dinner Bedtime Overall  Glucose range:       Averages: 115       POST-MEAL PC Breakfast PC Lunch PC Dinner  Glucose range:     Averages: 125 142 140    Weight control:  Wt Readings from Last 3 Encounters:  07/08/23 279 lb (126.6 kg)  04/07/23 290 lb 9.6 oz (131.8 kg)  02/25/23 295 lb (133.8 kg)            Diabetes labs:  Lab Results  Component Value Date   HGBA1C 6.1 07/05/2023   HGBA1C 6.5 03/29/2023   HGBA1C 11.9 (H) 11/12/2022   Lab Results  Component Value Date   MICROALBUR 31.8 (H) 07/05/2023   LDLCALC 95 07/05/2023   CREATININE 1.41 07/05/2023     Allergies as of 07/08/2023       Reactions   Keflex [cephalexin] Other (See Comments)   Upset stomach, felt overly tired and felt "crappy"   Oxycodone Rash   ITCHING AND UNABLE TO SLEEP            Medication List        Accurate as of July 08, 2023  9:05 AM. If you have any questions, ask your nurse or doctor.          atorvastatin 10 MG tablet Commonly known as: LIPITOR TAKE 1 TABLET BY MOUTH DAILY   B-D UF III MINI PEN NEEDLES 31G X 5 MM Misc Generic drug: Insulin Pen Needle Use to inject once daily   bisoprolol 5 MG tablet Commonly known as: ZEBETA Take 5 mg by mouth daily.   Chantix Continuing Month  Pak 1 MG tablet Generic drug: varenicline   dapagliflozin propanediol 10 MG Tabs tablet Commonly known as: Farxiga Take 1 tablet (10 mg total) by mouth daily before breakfast.   fluticasone furoate-vilanterol 100-25 MCG/INH Aepb Commonly known as: Breo Ellipta Inhale 1 puff into the lungs daily.   FreeStyle Libre 3 Sensor Misc APPLY 1 SENSOR ON UPPER ARM EVERY 14 DAYS   losartan 50 MG tablet Commonly known as: COZAAR TK 1 T PO QD   Melatonin 10 MG Tabs Take 10 mg by mouth at bedtime.   multivitamin with minerals Tabs tablet Take 1 tablet by mouth daily.   NEOMYCIN-POLYMYXIN-HYDROCORTISONE 1 % Soln OTIC solution Commonly known as: CORTISPORIN Apply 1-2 drops to toe BID after soaking   NovoLOG FlexPen 100 UNIT/ML FlexPen Generic drug: insulin aspart Upto 25 U ac tid   Otezla 30 MG Tabs Generic drug: Apremilast Take 1 tablet by mouth 2 (two) times daily.   Ozempic (2 MG/DOSE) 8 MG/3ML Sopn Generic drug: Semaglutide (2 MG/DOSE) INJECT 2 MG INTO THE SKIN ONCE WEEKLY   Semglee (yfgn) 100 UNIT/ML Pen Generic drug: insulin glargine-yfgn Inject 60 Units into the skin every morning. And pen needles 1/day What changed: how much to take   Taclonex external suspension Generic drug: calcipotriene-betamethasone Apply 1 application  topically daily. Applies to left shin and right ankle for psoriasis   tadalafil 5 MG tablet Commonly known as: CIALIS TK 1 T PO QD PRN   Tremfya 100 MG/ML pen Generic drug: guselkumab Inject into the skin.   Ventolin HFA 108 (90 Base) MCG/ACT inhaler Generic drug: albuterol INHALE 2 PUFFES EVERY 4-6 HOURS FOR COUGH OR SHORTNESS OF BREATH.        Allergies:  Allergies  Allergen Reactions   Keflex [Cephalexin] Other (See Comments)    Upset stomach, felt overly tired and felt "crappy"   Oxycodone Rash    ITCHING AND UNABLE TO SLEEP        Past Medical History:  Diagnosis Date   Diabetes mellitus without complication (HCC)     Diverticulitis    Hypertension    Plantar fasciitis    Sleep apnea     Past Surgical History:  Procedure Laterality Date   colonscopy     DENTAL SURGERY     FOOT SURGERY      Family History  Problem Relation Age of Onset  Heart disease Father    Colon cancer Father    Bipolar disorder Brother    Diabetes Neg Hx     Social History:  reports that he quit smoking about 6 years ago. His smoking use included cigarettes. He started smoking about 31 years ago. He has a 37.5 pack-year smoking history. He has never used smokeless tobacco. He reports current alcohol use. He reports that he does not use drugs.  Review of Systems:  History of erectile dysfunction  Hypertension: He has been on losartan and bisoprolol for treatment  BP Readings from Last 3 Encounters:  07/08/23 124/76  04/07/23 (!) 140/82  01/05/23 126/84   He has chronic kidney disease, not followed by nephrologist Taking Marcelline Deist Appears to have better renal function now especially compared to last year  Lab Results  Component Value Date   CREATININE 1.41 07/05/2023   CREATININE 1.63 (H) 03/29/2023   CREATININE 1.74 (H) 11/12/2022    Lipids: On Lipitor 10 mg with his LDL just below 100 now  With better diet and weight loss triglycerides are normal    Lab Results  Component Value Date   CHOL 152 07/05/2023   CHOL 162 03/29/2023   CHOL 174 11/12/2022   Lab Results  Component Value Date   HDL 37.10 (L) 07/05/2023   HDL 36.10 (L) 03/29/2023   HDL 34.80 (L) 11/12/2022   Lab Results  Component Value Date   LDLCALC 95 07/05/2023   LDLCALC 106 (H) 03/29/2023   LDLCALC 107 (H) 11/12/2022   Lab Results  Component Value Date   TRIG 97.0 07/05/2023   TRIG 100.0 03/29/2023   TRIG 161.0 (H) 11/12/2022   Lab Results  Component Value Date   CHOLHDL 4 07/05/2023   CHOLHDL 4 03/29/2023   CHOLHDL 5 11/12/2022   No results found for: "LDLDIRECT"   Examination:   BP 124/76   Pulse 62   Ht 6\' 4"   (1.93 m)   Wt 279 lb (126.6 kg)   SpO2 95%   BMI 33.96 kg/m   Body mass index is 33.96 kg/m.    ASSESSMENT/ PLAN:    Diabetes type 2:   Current regimen: GLARGINE INSULIN 30 units, OZEMPIC 2 mg, Farxiga 10 mg daily NovoLog 15 units at dinner  His A1c is at his best level of 6.1  See history of present illness for detailed discussion of current diabetes management, blood sugar patterns and problems identified  He has overall excellent control of his diabetes with weight loss with the lifestyle changes, use of freestyle libre and 2 mg Ozempic Overall has lost 23 pounds  Overall requiring less basal insulin but now fasting readings are stable with 30 units May also reduce the NovoLog to 12 units if eating small or low carbohydrate meal at dinnertime  Lipids: Now well-controlled, needs to take atorvastatin regularly   CKD: Renal function appears improved and likely benefited from better diabetes control, better hydration and use of Farxiga Microalbuminuria: Improving with better diabetes control and Farxiga  Total visit time for evaluation and management and counseling = 30 minutes  There are no Patient Instructions on file for this visit.   Reather Littler 07/08/2023, 9:05 AM

## 2023-07-09 ENCOUNTER — Encounter: Payer: Self-pay | Admitting: Endocrinology

## 2023-08-05 DIAGNOSIS — L400 Psoriasis vulgaris: Secondary | ICD-10-CM | POA: Diagnosis not present

## 2023-08-05 DIAGNOSIS — Z79899 Other long term (current) drug therapy: Secondary | ICD-10-CM | POA: Diagnosis not present

## 2023-08-05 DIAGNOSIS — L308 Other specified dermatitis: Secondary | ICD-10-CM | POA: Diagnosis not present

## 2023-08-18 DIAGNOSIS — Z111 Encounter for screening for respiratory tuberculosis: Secondary | ICD-10-CM | POA: Diagnosis not present

## 2023-08-18 DIAGNOSIS — Z79899 Other long term (current) drug therapy: Secondary | ICD-10-CM | POA: Diagnosis not present

## 2023-08-18 DIAGNOSIS — L400 Psoriasis vulgaris: Secondary | ICD-10-CM | POA: Diagnosis not present

## 2023-08-24 ENCOUNTER — Ambulatory Visit: Payer: BC Managed Care – PPO | Admitting: Podiatry

## 2023-08-26 ENCOUNTER — Ambulatory Visit (INDEPENDENT_AMBULATORY_CARE_PROVIDER_SITE_OTHER): Payer: BC Managed Care – PPO | Admitting: Podiatry

## 2023-08-26 ENCOUNTER — Encounter: Payer: Self-pay | Admitting: Podiatry

## 2023-08-26 DIAGNOSIS — M79676 Pain in unspecified toe(s): Secondary | ICD-10-CM | POA: Diagnosis not present

## 2023-08-26 DIAGNOSIS — B351 Tinea unguium: Secondary | ICD-10-CM | POA: Diagnosis not present

## 2023-08-26 DIAGNOSIS — E1142 Type 2 diabetes mellitus with diabetic polyneuropathy: Secondary | ICD-10-CM

## 2023-08-29 NOTE — Progress Notes (Signed)
He presents today chief complaint of painful elongated toenails and numbness in his feet and legs.  Objective: Vital signs stable alert oriented x 3.  Pulses are palpable.  Toenails are long thick yellow dystrophic onychomycotic no open lesions or wounds pulses are palpable.  Assessment: I diabetic peripheral neuropathy pain limb secondary to onychomycosis.  Plan: Debridement of toenails 1 through 5 bilateral.

## 2023-09-20 DIAGNOSIS — Z0000 Encounter for general adult medical examination without abnormal findings: Secondary | ICD-10-CM | POA: Diagnosis not present

## 2023-09-20 DIAGNOSIS — Z23 Encounter for immunization: Secondary | ICD-10-CM | POA: Diagnosis not present

## 2023-09-20 DIAGNOSIS — I129 Hypertensive chronic kidney disease with stage 1 through stage 4 chronic kidney disease, or unspecified chronic kidney disease: Secondary | ICD-10-CM | POA: Diagnosis not present

## 2023-09-20 DIAGNOSIS — E782 Mixed hyperlipidemia: Secondary | ICD-10-CM | POA: Diagnosis not present

## 2023-09-20 DIAGNOSIS — E1122 Type 2 diabetes mellitus with diabetic chronic kidney disease: Secondary | ICD-10-CM | POA: Diagnosis not present

## 2023-09-20 DIAGNOSIS — N1831 Chronic kidney disease, stage 3a: Secondary | ICD-10-CM | POA: Diagnosis not present

## 2023-10-01 ENCOUNTER — Other Ambulatory Visit: Payer: Self-pay

## 2023-10-01 DIAGNOSIS — E1165 Type 2 diabetes mellitus with hyperglycemia: Secondary | ICD-10-CM

## 2023-10-01 MED ORDER — FREESTYLE LIBRE 3 SENSOR MISC
1.0000 | 2 refills | Status: DC
Start: 2023-10-01 — End: 2024-02-29

## 2023-10-04 ENCOUNTER — Other Ambulatory Visit: Payer: Self-pay

## 2023-10-04 DIAGNOSIS — Z794 Long term (current) use of insulin: Secondary | ICD-10-CM

## 2023-10-04 MED ORDER — FREESTYLE LIBRE 3 PLUS SENSOR MISC
1.0000 | 3 refills | Status: AC
Start: 2023-10-04 — End: ?

## 2023-10-05 ENCOUNTER — Telehealth: Payer: Self-pay

## 2023-10-05 NOTE — Telephone Encounter (Signed)
Fax sent for PA for freestyle libre 3 plus sensor

## 2023-10-06 ENCOUNTER — Telehealth: Payer: Self-pay

## 2023-10-06 ENCOUNTER — Other Ambulatory Visit (HOSPITAL_COMMUNITY): Payer: Self-pay

## 2023-10-06 NOTE — Telephone Encounter (Signed)
complete

## 2023-10-06 NOTE — Telephone Encounter (Signed)
Pharmacy Patient Advocate Encounter   Received notification from Pt Calls Messages that prior authorization for Freestyle libre 3 plus is required/requested.   Insurance verification completed.   The patient is insured through [facility] .   Per test claim: PA required; PA started via CoverMyMeds. KEY [id number] . Waiting for clinical questions to populate.

## 2023-10-15 NOTE — Telephone Encounter (Signed)
Clinical info including chart notes and labs have been submitted

## 2023-10-21 ENCOUNTER — Other Ambulatory Visit (HOSPITAL_COMMUNITY): Payer: Self-pay

## 2023-10-21 NOTE — Telephone Encounter (Signed)
Received fax for additional info. Faxed back 10/21/2023

## 2023-10-22 ENCOUNTER — Telehealth: Payer: Self-pay

## 2023-10-22 NOTE — Telephone Encounter (Signed)
Patient called and made aware Freestyle Libre 3 plus was approved.

## 2023-10-22 NOTE — Telephone Encounter (Signed)
Pharmacy Patient Advocate Encounter  Received notification from [facility] that Prior Authorization for [facility] 3 plus has been APPROVED through 10/14/2024   PA #/Case ID/Reference #: [id number]

## 2023-10-26 ENCOUNTER — Other Ambulatory Visit: Payer: Self-pay

## 2023-10-26 ENCOUNTER — Other Ambulatory Visit (INDEPENDENT_AMBULATORY_CARE_PROVIDER_SITE_OTHER): Payer: BC Managed Care – PPO

## 2023-10-26 DIAGNOSIS — Z794 Long term (current) use of insulin: Secondary | ICD-10-CM

## 2023-10-26 DIAGNOSIS — E1165 Type 2 diabetes mellitus with hyperglycemia: Secondary | ICD-10-CM | POA: Diagnosis not present

## 2023-10-26 LAB — BASIC METABOLIC PANEL
BUN: 32 mg/dL — ABNORMAL HIGH (ref 6–23)
CO2: 27 meq/L (ref 19–32)
Calcium: 9.8 mg/dL (ref 8.4–10.5)
Chloride: 103 meq/L (ref 96–112)
Creatinine, Ser: 1.45 mg/dL (ref 0.40–1.50)
GFR: 52.79 mL/min — ABNORMAL LOW (ref >60.00)
Glucose, Bld: 135 mg/dL — ABNORMAL HIGH (ref 70–99)
Potassium: 4.4 meq/L (ref 3.5–5.1)
Sodium: 139 meq/L (ref 135–145)

## 2023-10-26 LAB — HEMOGLOBIN A1C: Hgb A1c MFr Bld: 6.5 % (ref 4.6–6.5)

## 2023-11-01 ENCOUNTER — Encounter: Payer: Self-pay | Admitting: Endocrinology

## 2023-11-01 ENCOUNTER — Ambulatory Visit: Payer: BC Managed Care – PPO | Admitting: Endocrinology

## 2023-11-01 DIAGNOSIS — E118 Type 2 diabetes mellitus with unspecified complications: Secondary | ICD-10-CM | POA: Diagnosis not present

## 2023-11-01 DIAGNOSIS — Z794 Long term (current) use of insulin: Secondary | ICD-10-CM | POA: Diagnosis not present

## 2023-11-01 MED ORDER — FREESTYLE LIBRE 3 PLUS SENSOR MISC: 1.0000 | 3 refills | Status: DC

## 2023-11-01 MED ORDER — OZEMPIC (2 MG/DOSE) 8 MG/3ML ~~LOC~~ SOPN: 2.0000 mg | PEN_INJECTOR | SUBCUTANEOUS | 4 refills | Status: DC

## 2023-11-01 MED ORDER — NOVOLOG FLEXPEN 100 UNIT/ML ~~LOC~~ SOPN: PEN_INJECTOR | SUBCUTANEOUS | 5 refills | Status: DC

## 2023-11-01 MED ORDER — SEMGLEE (YFGN) 100 UNIT/ML ~~LOC~~ SOPN: 30.0000 [IU] | PEN_INJECTOR | SUBCUTANEOUS | 3 refills | Status: DC

## 2023-11-01 MED ORDER — DAPAGLIFLOZIN PROPANEDIOL 10 MG PO TABS: 10.0000 mg | ORAL_TABLET | Freq: Every day | ORAL | 3 refills | Status: DC

## 2023-11-01 NOTE — Patient Instructions (Signed)
No change in diabetes medications.

## 2023-11-01 NOTE — Progress Notes (Signed)
Outpatient Endocrinology Note Dennis Kamdon Reisig, MD  11/01/23  Patient's Name: Dennis Sims    DOB: 19-Apr-1966    MRN: [id number]                                                    REASON OF VISIT: Follow up of type 2 diabetes mellitus  PCP: Sigmund Hazel, MD  HISTORY OF PRESENT ILLNESS:   Dennis Sims is a 57 y.o. old male with past medical history listed below, is here for follow up for type 2 diabetes mellitus.  Patient was last time seen by Dr. Lucianne Muss in July 2024.  Pertinent Diabetes History: Patient was diagnosed with type 2 diabetes mellitus in 2019.  Ozempic was started in 9/22 Marcelline Deist was started in 12/2021 BASAL insulin was started in 2020  Chronic Diabetes Complications : Retinopathy: no. Last ophthalmology exam was done on annually, following with ophthalmology regularly.  Nephropathy: yes, microalbuminuria, on ACE/ARB / losartan Peripheral neuropathy: no  Coronary artery disease: no Stroke: no  Relevant comorbidities and cardiovascular risk factors: Obesity: yes Body mass index is 34.91 kg/m.  Hypertension: Yes  Hyperlipidemia : Yes, on statin   Current / Home Diabetic regimen includes:  Semglee 30 units in the morning daily. NovoLog 15 units before dinner. Ozempic 2 mg weekly. Farxiga 10 mg daily.   Prior diabetic medications:  Glycemic data:    CONTINUOUS GLUCOSE MONITORING SYSTEM (CGMS) INTERPRETATION: At today's visit, we reviewed CGM downloads. The full report is scanned in the media. Reviewing the CGM trends, blood glucose are as follows:  FreeStyle Libre 3 CGM-  Sensor Download (Sensor download was reviewed and summarized below.) Dates: November 5 to November 01, 2023, 14 days Sensor Average: 137  Glucose Management Indicator: 6.6% Glucose Variability: 19.1%  % data captured: 91%  Glycemic Trends:  <54: 0% 54-70: 0% 71-180: 93% 181-250: 7% 251-400: 0%   Interpretation: -Mostly acceptable blood sugar with occasional mild  hyperglycemia related with meals with blood sugar up to 200 range.  No hypoglycemia.  Overnight and fasting blood sugar acceptable.  Hypoglycemia: Patient has no hypoglycemic episodes. Patient has hypoglycemia awareness.  Factors modifying glucose control: 1.  Diabetic diet assessment: 3 meals a day.  2.  Staying active or exercising: Physically active at work.  No formal exercise.  3.  Medication compliance: compliant all of the time.  Interval history  CGM data as reviewed above.  Mostly acceptable blood sugar.  Diabetes regimen as noted above.  He has no complaints today.  REVIEW OF SYSTEMS As per history of present illness.   PAST MEDICAL HISTORY: Past Medical History:  Diagnosis Date   Diabetes mellitus without complication (HCC)    Diverticulitis    Hypertension    Plantar fasciitis    Sleep apnea     PAST SURGICAL HISTORY: Past Surgical History:  Procedure Laterality Date   colonscopy     DENTAL SURGERY     FOOT SURGERY      ALLERGIES: Allergies  Allergen Reactions   Keflex [Cephalexin] Other (See Comments)    Upset stomach, felt overly tired and felt "crappy"   Oxycodone Rash    ITCHING AND UNABLE TO SLEEP        FAMILY HISTORY:  Family History  Problem Relation Age of Onset   Heart disease Father    Colon  cancer Father    Bipolar disorder Brother    Diabetes Neg Hx     SOCIAL HISTORY: Social History   Socioeconomic History   Marital status: Married    Spouse name: Not on file   Number of children: Not on file   Years of education: Not on file   Highest education level: Not on file  Occupational History   Not on file  Tobacco Use   Smoking status: Former    Current packs/day: 0.00    Average packs/day: 1.5 packs/day for 25.0 years (37.5 ttl pk-yrs)    Types: Cigarettes    Start date: 08/08/1991    Quit date: 08/07/2016    Years since quitting: 7.2   Smokeless tobacco: Never  Substance and Sexual Activity   Alcohol use: Yes    Comment:  BEER AND ALCOHOL   Drug use: No   Sexual activity: Not on file  Other Topics Concern   Not on file  Social History Narrative   Own a Patent examiner business.    Social Determinants of Health   Financial Resource Strain: Low Risk  (10/19/2022)   Overall Financial Resource Strain (CARDIA)    Difficulty of Paying Living Expenses: Not hard at all  Food Insecurity: No Food Insecurity (10/19/2022)   Hunger Vital Sign    Worried About Running Out of Food in the Last Year: Never true    Ran Out of Food in the Last Year: Never true  Transportation Needs: No Transportation Needs (10/19/2022)   PRAPARE - Administrator, Civil Service (Medical): No    Lack of Transportation (Non-Medical): No  Physical Activity: Not on file  Stress: Not on file  Social Connections: Not on file    MEDICATIONS:  Current Outpatient Medications  Medication Sig Dispense Refill   atorvastatin (LIPITOR) 10 MG tablet TAKE 1 TABLET BY MOUTH DAILY 90 tablet 1   bisoprolol (ZEBETA) 5 MG tablet Take 5 mg by mouth daily.     CHANTIX CONTINUING MONTH PAK 1 MG tablet      Continuous Glucose Sensor (FREESTYLE LIBRE 3 PLUS SENSOR) MISC Inject 1 Device into the skin continuous. Change every 15 days 6 each 3   Continuous Glucose Sensor (FREESTYLE LIBRE 3 PLUS SENSOR) MISC 1 each by Does not apply route continuous. Change every 15 days. 6 each 3   Continuous Glucose Sensor (FREESTYLE LIBRE 3 SENSOR) MISC 1 Device by Does not apply route every 14 (fourteen) days. 6 each 2   fluticasone furoate-vilanterol (BREO ELLIPTA) 100-25 MCG/INH AEPB Inhale 1 puff into the lungs daily. 28 each 0   Insulin Pen Needle (B-D UF III MINI PEN NEEDLES) 31G X 5 MM MISC Use to inject once daily 100 each 3   losartan (COZAAR) 50 MG tablet TK 1 T PO QD     Melatonin 10 MG TABS Take 10 mg by mouth at bedtime.     Multiple Vitamin (MULTIVITAMIN WITH MINERALS) TABS tablet Take 1 tablet by mouth daily.     NEOMYCIN-POLYMYXIN-HYDROCORTISONE  (CORTISPORIN) 1 % SOLN OTIC solution Apply 1-2 drops to toe BID after soaking 10 mL 1   OTEZLA 30 MG TABS Take 1 tablet by mouth 2 (two) times daily.     TACLONEX external suspension Apply 1 application  topically daily. Applies to left shin and right ankle for psoriasis  2   tadalafil (CIALIS) 5 MG tablet TK 1 T PO QD PRN     TREMFYA 100 MG/ML SOPN Inject into  the skin.     VENTOLIN HFA 108 (90 Base) MCG/ACT inhaler INHALE 2 PUFFES EVERY 4-6 HOURS FOR COUGH OR SHORTNESS OF BREATH. 18 g 0   dapagliflozin propanediol (FARXIGA) 10 MG TABS tablet Take 1 tablet (10 mg total) by mouth daily before breakfast. 90 tablet 3   insulin aspart (NOVOLOG FLEXPEN) 100 UNIT/ML FlexPen Upto 25 U ac tid 15 mL 5   Semaglutide, 2 MG/DOSE, (OZEMPIC, 2 MG/DOSE,) 8 MG/3ML SOPN Inject 2 mg into the skin once a week. 9 mL 4   SEMGLEE, YFGN, 100 UNIT/ML Pen Inject 30 Units into the skin every morning. And pen needles 1/day 21 mL 3   No current facility-administered medications for this visit.    PHYSICAL EXAM: Vitals:   11/01/23 0949  BP: 118/60  Pulse: 61  Resp: 20  SpO2: 95%  Weight: 286 lb 12.8 oz (130.1 kg)  Height: 6\' 4"  (1.93 m)   Body mass index is 34.91 kg/m.  Wt Readings from Last 3 Encounters:  11/01/23 286 lb 12.8 oz (130.1 kg)  07/08/23 279 lb (126.6 kg)  04/07/23 290 lb 9.6 oz (131.8 kg)    General: Well developed, well nourished male in no apparent distress.  HEENT: AT/[place], no external lesions.  Eyes: Conjunctiva clear and no icterus. Neck: Neck supple  Lungs: Respirations not labored Neurologic: Alert, oriented, normal speech Extremities / Skin: Dry. No sores or rashes noted. No acanthosis nigricans Psychiatric: Does not appear depressed or anxious  Diabetic Foot Exam: 11/01/2023  Diabetic Foot Exam - Simple   Simple Foot Form Diabetic Foot exam was performed with the following findings: Yes 11/01/2023 10:15 AM  Visual Inspection No deformities, no ulcerations, no other skin  breakdown bilaterally: Yes See comments: Yes Sensation Testing Intact to touch and monofilament testing bilaterally: Yes Pulse Check See comments: Yes Comments Dry feet.  DP 2+ bilaterally.     LABS Reviewed Lab Results  Component Value Date   HGBA1C 6.5 10/26/2023   HGBA1C 6.1 07/05/2023   HGBA1C 6.5 03/29/2023   Lab Results  Component Value Date   FRUCTOSAMINE 353 (H) 05/07/2022   Lab Results  Component Value Date   CHOL 152 07/05/2023   HDL 37.10 (L) 07/05/2023   LDLCALC 95 07/05/2023   TRIG 97.0 07/05/2023   CHOLHDL 4 07/05/2023   Lab Results  Component Value Date   MICRALBCREAT 34.2 (H) 07/05/2023   MICRALBCREAT 78.9 (H) 05/07/2022   Lab Results  Component Value Date   CREATININE 1.45 10/26/2023   Lab Results  Component Value Date   GFR 52.79 (L) 10/26/2023    ASSESSMENT / PLAN  1. Controlled type 2 diabetes mellitus with complication, with long-term current use of insulin (HCC)     Diabetes Mellitus type 2, complicated by CKD/microalbuminuria - Diabetic status / severity: Controlled.  Lab Results  Component Value Date   HGBA1C 6.5 10/26/2023    - Hemoglobin A1c goal : <7%  - Medications: No change. Semglee 30 units in the morning daily. NovoLog 15 units before dinner. Ozempic 2 mg weekly. Farxiga 10 mg daily.    - Home glucose testing: Freestyle libre/CGM and check as needed. - Discussed/ Gave Hypoglycemia treatment plan.  # Consult : not required at this time.   # Annual urine for microalbuminuria/ creatinine ratio, microalbuminuria currently, continue ACE/ARB /losartan/Farxiga.  Has microalbuminuria.  Not seeing nephrology. Last  Lab Results  Component Value Date   MICRALBCREAT 34.2 (H) 07/05/2023    # Foot check nightly.  #  Annual dilated diabetic eye exams.   - Diet: Make healthy diabetic food choices - Life style / activity / exercise: Discussed.  2. Blood pressure  -  BP Readings from Last 1 Encounters:  11/01/23  118/60    - Control is in target.  - No change in current plans.  3. Lipid status / Hyperlipidemia - Last  Lab Results  Component Value Date   LDLCALC 95 07/05/2023   - Continue atorvastatin 10 mg daily.  Diagnoses and all orders for this visit:  Controlled type 2 diabetes mellitus with complication, with long-term current use of insulin (HCC) -     insulin aspart (NOVOLOG FLEXPEN) 100 UNIT/ML FlexPen; Upto 25 U ac tid -     SEMGLEE, YFGN, 100 UNIT/ML Pen; Inject 30 Units into the skin every morning. And pen needles 1/day -     Semaglutide, 2 MG/DOSE, (OZEMPIC, 2 MG/DOSE,) 8 MG/3ML SOPN; Inject 2 mg into the skin once a week.  Other orders -     dapagliflozin propanediol (FARXIGA) 10 MG TABS tablet; Take 1 tablet (10 mg total) by mouth daily before breakfast. -     Continuous Glucose Sensor (FREESTYLE LIBRE 3 PLUS SENSOR) MISC; 1 each by Does not apply route continuous. Change every 15 days.    DISPOSITION Follow up in clinic in 4 months suggested.   All questions answered and patient verbalized understanding of the plan.  Dennis Letricia Krinsky, MD [facility] Endocrinology [facility] [address] [place], [address] [place], [place] [address] Phone # [phone]  At least part of this note was generated using voice recognition software. Inadvertent word errors may have occurred, which were not recognized during the proofreading process.

## 2023-12-21 ENCOUNTER — Ambulatory Visit (INDEPENDENT_AMBULATORY_CARE_PROVIDER_SITE_OTHER): Payer: BC Managed Care – PPO | Admitting: Podiatry

## 2023-12-21 ENCOUNTER — Encounter: Payer: Self-pay | Admitting: Podiatry

## 2023-12-21 DIAGNOSIS — B351 Tinea unguium: Secondary | ICD-10-CM | POA: Diagnosis not present

## 2023-12-21 DIAGNOSIS — E1142 Type 2 diabetes mellitus with diabetic polyneuropathy: Secondary | ICD-10-CM

## 2023-12-21 DIAGNOSIS — M79676 Pain in unspecified toe(s): Secondary | ICD-10-CM

## 2023-12-21 NOTE — Progress Notes (Signed)
 He presents today chief complaint of painfully elongated toenails.  Objective: Toenails are long thick yellow dystrophic like mycotic no open lesions or wounds.  No signs of psoriasis on the cutaneous portion of the feet today he does have some pitting in the nails which are characteristic of psoriatic dermatitis.  Assessment diabetes mellitus neuropathy psoriasis pain limb secondary to onychomycosis.  Plan: Debridement of toenails bilateral.

## 2023-12-31 ENCOUNTER — Other Ambulatory Visit: Payer: Self-pay

## 2023-12-31 DIAGNOSIS — E7800 Pure hypercholesterolemia, unspecified: Secondary | ICD-10-CM

## 2023-12-31 MED ORDER — ATORVASTATIN CALCIUM 10 MG PO TABS: ORAL_TABLET | ORAL | 1 refills | Status: AC

## 2024-01-14 DIAGNOSIS — M546 Pain in thoracic spine: Secondary | ICD-10-CM | POA: Diagnosis not present

## 2024-02-08 DIAGNOSIS — Z79899 Other long term (current) drug therapy: Secondary | ICD-10-CM | POA: Diagnosis not present

## 2024-02-08 DIAGNOSIS — L400 Psoriasis vulgaris: Secondary | ICD-10-CM | POA: Diagnosis not present

## 2024-02-08 DIAGNOSIS — L720 Epidermal cyst: Secondary | ICD-10-CM | POA: Diagnosis not present

## 2024-02-29 ENCOUNTER — Ambulatory Visit (INDEPENDENT_AMBULATORY_CARE_PROVIDER_SITE_OTHER): Payer: BC Managed Care – PPO | Admitting: Endocrinology

## 2024-02-29 ENCOUNTER — Encounter: Payer: Self-pay | Admitting: Endocrinology

## 2024-02-29 VITALS — BP 118/80 | HR 61 | Resp 20 | Ht 76.0 in | Wt 286.2 lb

## 2024-02-29 DIAGNOSIS — Z794 Long term (current) use of insulin: Secondary | ICD-10-CM | POA: Diagnosis not present

## 2024-02-29 DIAGNOSIS — E119 Type 2 diabetes mellitus without complications: Secondary | ICD-10-CM | POA: Diagnosis not present

## 2024-02-29 LAB — POCT GLYCOSYLATED HEMOGLOBIN (HGB A1C): Hemoglobin A1C: 6.2 % — AB (ref 4.0–5.6)

## 2024-02-29 MED ORDER — TIRZEPATIDE 10 MG/0.5ML ~~LOC~~ SOAJ: 10.0000 mg | SUBCUTANEOUS | 11 refills | Status: DC

## 2024-02-29 NOTE — Patient Instructions (Signed)
 Change ozempic to Mounjaro 10 mg weekly, check coverage with pharmacy.   After 4 injections please let me know if no side effects will increase to 12.5 mg weekly.

## 2024-02-29 NOTE — Progress Notes (Signed)
 Outpatient Endocrinology Note Dennis Garrie Elenes, MD  02/29/24  Patient's Name: Dennis Sims    DOB: [birth year]    MRN: [id number]                                                    REASON OF VISIT: Follow up of type 2 diabetes mellitus  PCP: Sigmund Hazel, MD  HISTORY OF PRESENT ILLNESS:   Dennis Sims is a 58 y.o. old male with past medical history listed below, is here for follow up for type 2 diabetes mellitus.    Pertinent Diabetes History: Patient was previously seen by Dr. Lucianne Muss and was last time seen in July 2024. Patient was diagnosed with type 2 diabetes mellitus in 2019.  Ozempic was started in 9/22 Marcelline Deist was started in 12/2021 BASAL insulin was started in 2020  Chronic Diabetes Complications : Retinopathy: no. Last ophthalmology exam was done on annually, following with ophthalmology regularly.  Nephropathy: yes, microalbuminuria, on ACE/ARB / losartan Peripheral neuropathy: no  Coronary artery disease: no Stroke: no  Relevant comorbidities and cardiovascular risk factors: Obesity: yes Body mass index is 34.84 kg/m.  Hypertension: Yes  Hyperlipidemia : Yes, on statin   Current / Home Diabetic regimen includes:  Semglee 30 units in the morning daily. NovoLog 15 units before dinner. Ozempic 2 mg weekly. Farxiga 10 mg daily.   Prior diabetic medications:  Glycemic data:    CONTINUOUS GLUCOSE MONITORING SYSTEM (CGMS) INTERPRETATION: At today's visit, we reviewed CGM downloads. The full report is scanned in the media. Reviewing the CGM trends, blood glucose are as follows:  FreeStyle Libre 3 CGM-  Sensor Download (Sensor download was reviewed and summarized below.) Dates: March 5 to February 29, 2024, 14 days Sensor Average: 145 Glucose Management Indicator: 6.8%     Interpretation: Mostly acceptable blood sugar.  Rare mild hyperglycemia with blood sugar up to 200 range postprandially especially with her evening meal.  No hypoglycemia.  Blood sugar in  between the meals and overnight are acceptable.  Hypoglycemia: Patient has no hypoglycemic episodes. Patient has hypoglycemia awareness.  Factors modifying glucose control: 1.  Diabetic diet assessment: 3 meals a day.  2.  Staying active or exercising: Physically active at work.  Swimming regularly.  3.  Medication compliance: compliant all of the time.  Interval history  CGM data as reviewed above.  Diabetes regimen as reviewed above.  Hemoglobin A1c improved to 6.2% today.  He reports he has started swimming regularly, still not losing weight.  Body weight has remained stable.  No other complaints today.  REVIEW OF SYSTEMS As per history of present illness.   PAST MEDICAL HISTORY: Past Medical History:  Diagnosis Date   Diabetes mellitus without complication (HCC)    Diverticulitis    Hypertension    Plantar fasciitis    Sleep apnea     PAST SURGICAL HISTORY: Past Surgical History:  Procedure Laterality Date   colonscopy     DENTAL SURGERY     FOOT SURGERY      ALLERGIES: Allergies  Allergen Reactions   Keflex [Cephalexin] Other (See Comments)    Upset stomach, felt overly tired and felt "crappy"   Oxycodone Rash    ITCHING AND UNABLE TO SLEEP        FAMILY HISTORY:  Family History  Problem Relation Age of Onset  Heart disease Father    Colon cancer Father    Bipolar disorder Brother    Diabetes Neg Hx     SOCIAL HISTORY: Social History   Socioeconomic History   Marital status: Married    Spouse name: Not on file   Number of children: Not on file   Years of education: Not on file   Highest education level: Not on file  Occupational History   Not on file  Tobacco Use   Smoking status: Former    Current packs/day: 0.00    Average packs/day: 1.5 packs/day for 25.0 years (37.5 ttl pk-yrs)    Types: Cigarettes    Start date: 08/08/1991    Quit date: 08/07/2016    Years since quitting: 7.5   Smokeless tobacco: Never  Substance and Sexual Activity    Alcohol use: Yes    Comment: BEER AND ALCOHOL   Drug use: No   Sexual activity: Not on file  Other Topics Concern   Not on file  Social History Narrative   Own a Patent examiner business.    Social Drivers of Corporate investment banker Strain: Low Risk  (10/19/2022)   Overall Financial Resource Strain (CARDIA)    Difficulty of Paying Living Expenses: Not hard at all  Food Insecurity: No Food Insecurity (10/19/2022)   Hunger Vital Sign    Worried About Running Out of Food in the Last Year: Never true    Ran Out of Food in the Last Year: Never true  Transportation Needs: No Transportation Needs (10/19/2022)   PRAPARE - Administrator, Civil Service (Medical): No    Lack of Transportation (Non-Medical): No  Physical Activity: Not on file  Stress: Not on file  Social Connections: Not on file    MEDICATIONS:  Current Outpatient Medications  Medication Sig Dispense Refill   tirzepatide (MOUNJARO) 10 MG/0.5ML Pen Inject 10 mg into the skin once a week. 2 mL 11   atorvastatin (LIPITOR) 10 MG tablet TAKE 1 TABLET BY MOUTH DAILY 90 tablet 1   bisoprolol (ZEBETA) 5 MG tablet Take 5 mg by mouth daily.     Continuous Glucose Sensor (FREESTYLE LIBRE 3 PLUS SENSOR) MISC Inject 1 Device into the skin continuous. Change every 15 days 6 each 3   Continuous Glucose Sensor (FREESTYLE LIBRE 3 PLUS SENSOR) MISC 1 each by Does not apply route continuous. Change every 15 days. 6 each 3   dapagliflozin propanediol (FARXIGA) 10 MG TABS tablet Take 1 tablet (10 mg total) by mouth daily before breakfast. 90 tablet 3   insulin aspart (NOVOLOG FLEXPEN) 100 UNIT/ML FlexPen Upto 25 U ac tid 15 mL 5   Insulin Pen Needle (B-D UF III MINI PEN NEEDLES) 31G X 5 MM MISC Use to inject once daily 100 each 3   losartan (COZAAR) 50 MG tablet TK 1 T PO QD     Melatonin 10 MG TABS Take 10 mg by mouth at bedtime.     Multiple Vitamin (MULTIVITAMIN WITH MINERALS) TABS tablet Take 1 tablet by mouth daily.      NEOMYCIN-POLYMYXIN-HYDROCORTISONE (CORTISPORIN) 1 % SOLN OTIC solution Apply 1-2 drops to toe BID after soaking 10 mL 1   OTEZLA 30 MG TABS Take 1 tablet by mouth 2 (two) times daily.     Semaglutide, 2 MG/DOSE, (OZEMPIC, 2 MG/DOSE,) 8 MG/3ML SOPN Inject 2 mg into the skin once a week. 9 mL 4   SEMGLEE, YFGN, 100 UNIT/ML Pen Inject 30 Units into  the skin every morning. And pen needles 1/day 21 mL 3   tadalafil (CIALIS) 5 MG tablet TK 1 T PO QD PRN     TREMFYA 100 MG/ML SOPN Inject into the skin.     VENTOLIN HFA 108 (90 Base) MCG/ACT inhaler INHALE 2 PUFFES EVERY 4-6 HOURS FOR COUGH OR SHORTNESS OF BREATH. 18 g 0   No current facility-administered medications for this visit.    PHYSICAL EXAM: Vitals:   02/29/24 0836  BP: 118/80  Pulse: 61  Resp: 20  SpO2: 94%  Weight: 286 lb 3.2 oz (129.8 kg)  Height: 6\' 4"  (1.93 m)    Body mass index is 34.84 kg/m.  Wt Readings from Last 3 Encounters:  02/29/24 286 lb 3.2 oz (129.8 kg)  11/01/23 286 lb 12.8 oz (130.1 kg)  07/08/23 279 lb (126.6 kg)    General: Well developed, well nourished male in no apparent distress.  HEENT: AT/[place], no external lesions.  Eyes: Conjunctiva clear and no icterus. Neck: Neck supple  Lungs: Respirations not labored Neurologic: Alert, oriented, normal speech Extremities / Skin: Dry. No sores or rashes noted. No acanthosis nigricans Psychiatric: Does not appear depressed or anxious  Diabetic Foot Exam: 11/01/2023  Diabetic Foot Exam - Simple   No data filed    LABS Reviewed Lab Results  Component Value Date   HGBA1C 6.2 (A) 02/29/2024   HGBA1C 6.5 10/26/2023   HGBA1C 6.1 07/05/2023   Lab Results  Component Value Date   FRUCTOSAMINE 353 (H) 05/07/2022   Lab Results  Component Value Date   CHOL 152 07/05/2023   HDL 37.10 (L) 07/05/2023   LDLCALC 95 07/05/2023   TRIG 97.0 07/05/2023   CHOLHDL 4 07/05/2023   Lab Results  Component Value Date   MICRALBCREAT 34.2 (H) 07/05/2023   MICRALBCREAT  78.9 (H) 05/07/2022   Lab Results  Component Value Date   CREATININE 1.45 10/26/2023   Lab Results  Component Value Date   GFR 52.79 (L) 10/26/2023    ASSESSMENT / PLAN  1. Type 2 diabetes mellitus without complication, with long-term current use of insulin (HCC)     Diabetes Mellitus type 2, complicated by CKD/microalbuminuria - Diabetic status / severity: Controlled.  Lab Results  Component Value Date   HGBA1C 6.2 (A) 02/29/2024    - Hemoglobin A1c goal : <6.5%  Patient is not able to lose despite regular exercise/swimming.  I would like to switch Ozempic to [facility], diabetes regimen is adjusted below.  - Medications: No change. Semglee 30 units in the morning daily. NovoLog 15 units before dinner. Stop Ozempic 2 mg weekly.  And start Mounjaro 10 mg weekly, patient is asked to call our clinic in a month if no side effect will increase to 12.5 mg weekly. Farxiga 10 mg daily.    - Home glucose testing: Freestyle libre/CGM and check as needed. - Discussed/ Gave Hypoglycemia treatment plan.  # Consult : not required at this time.   # Annual urine for microalbuminuria/ creatinine ratio, microalbuminuria currently, continue ACE/ARB /losartan/Farxiga.  Has microalbuminuria.  Not seeing nephrology. Last  Lab Results  Component Value Date   MICRALBCREAT 34.2 (H) 07/05/2023    # Foot check nightly.  # Annual dilated diabetic eye exams.   - Diet: Make healthy diabetic food choices - Life style / activity / exercise: Discussed.  2. Blood pressure  -  BP Readings from Last 1 Encounters:  02/29/24 118/80    - Control is in target.  - No  change in current plans.  3. Lipid status / Hyperlipidemia - Last  Lab Results  Component Value Date   LDLCALC 95 07/05/2023   - Continue atorvastatin 10 mg daily.  Diagnoses and all orders for this visit:  Type 2 diabetes mellitus without complication, with long-term current use of insulin (HCC) -     POCT glycosylated  hemoglobin (Hb A1C) -     tirzepatide (MOUNJARO) 10 MG/0.5ML Pen; Inject 10 mg into the skin once a week.    DISPOSITION Follow up in clinic in 4 months suggested.   All questions answered and patient verbalized understanding of the plan.  Dennis Nelson Julson, MD [facility] Endocrinology [facility] [address] [place], [address] [place], [place] [address] Phone # [phone]  At least part of this note was generated using voice recognition software. Inadvertent word errors may have occurred, which were not recognized during the proofreading process.

## 2024-03-01 ENCOUNTER — Encounter: Payer: Self-pay | Admitting: Endocrinology

## 2024-03-06 ENCOUNTER — Telehealth: Payer: Self-pay

## 2024-03-06 NOTE — Telephone Encounter (Signed)
 Pharmacy Patient Advocate Encounter   Received notification from CoverMyMeds that prior authorization for Mounjaro  10 mg/0.5 ml auto injectors is required/requested.   Insurance verification completed.   The patient is insured through [facility] .   Per test claim: PA required; PA submitted to above mentioned insurance via CoverMyMeds Key/confirmation #/EOC [id number] Status is pending

## 2024-03-13 NOTE — Telephone Encounter (Signed)
 Pharmacy Patient Advocate Encounter  Received notification from [facility] that Prior Authorization for Mounjaro  10 mg/0.5 ml auto injectors has been APPROVED from 03-06-2024 to 03-06-2025   PA #/Case ID/Reference #: [id number]

## 2024-03-26 DIAGNOSIS — H6122 Impacted cerumen, left ear: Secondary | ICD-10-CM | POA: Diagnosis not present

## 2024-04-17 ENCOUNTER — Telehealth: Payer: Self-pay | Admitting: Endocrinology

## 2024-04-17 NOTE — Telephone Encounter (Addendum)
 MEDICATION: [facility]  PHARMACY:  [id number] - [place], [place] - [address] AT [facility] OF [address] (Ph: [phone])   HAS THE PATIENT CONTACTED THEIR PHARMACY?  Yes  IS THIS A 90 DAY SUPPLY : Yes  IS PATIENT OUT OF MEDICATION:   IF NOT; HOW MUCH IS LEFT:   LAST APPOINTMENT DATE: @ 02/29/2024  NEXT APPOINTMENT DATE:@7 /30/2025  DO WE HAVE YOUR PERMISSION TO LEAVE A DETAILED MESSAGE?:Yes  OTHER COMMENTS: Patient states that he thought after a month that the Mounjaro was supposed to go up, but the pharmacy only has the prescription for  tirzepatide tirzepatide (MOUNJARO) 10 MG/0.5ML Pen   **Let patient know to contact pharmacy at the end of the day to make sure medication is ready. **  ** Please notify patient to allow 48-72 hours to process**  **Encourage patient to contact the pharmacy for refills or they can request refills through [facility]**

## 2024-04-18 ENCOUNTER — Other Ambulatory Visit: Payer: Self-pay

## 2024-04-18 DIAGNOSIS — E119 Type 2 diabetes mellitus without complications: Secondary | ICD-10-CM

## 2024-04-18 MED ORDER — TIRZEPATIDE 10 MG/0.5ML ~~LOC~~ SOAJ: 10.0000 mg | SUBCUTANEOUS | 11 refills | Status: DC

## 2024-05-18 ENCOUNTER — Telehealth: Payer: Self-pay

## 2024-05-18 DIAGNOSIS — Z794 Long term (current) use of insulin: Secondary | ICD-10-CM

## 2024-05-18 MED ORDER — TIRZEPATIDE 12.5 MG/0.5ML ~~LOC~~ SOAJ: 12.5000 mg | SUBCUTANEOUS | 4 refills | Status: DC

## 2024-05-18 NOTE — Telephone Encounter (Signed)
 Sent prescription for Mounjaro 12.5 mg weekly.

## 2024-05-18 NOTE — Telephone Encounter (Signed)
 Patient called requesting to increase Mounjaro.

## 2024-05-23 ENCOUNTER — Other Ambulatory Visit: Payer: Self-pay

## 2024-05-23 DIAGNOSIS — E1165 Type 2 diabetes mellitus with hyperglycemia: Secondary | ICD-10-CM

## 2024-05-23 MED ORDER — BD PEN NEEDLE MINI U/F 31G X 5 MM MISC: 3 refills | Status: AC

## 2024-05-25 ENCOUNTER — Ambulatory Visit (INDEPENDENT_AMBULATORY_CARE_PROVIDER_SITE_OTHER): Admitting: Podiatry

## 2024-05-25 ENCOUNTER — Encounter: Payer: Self-pay | Admitting: Podiatry

## 2024-05-25 DIAGNOSIS — J4520 Mild intermittent asthma, uncomplicated: Secondary | ICD-10-CM | POA: Insufficient documentation

## 2024-05-25 DIAGNOSIS — Z6837 Body mass index (BMI) 37.0-37.9, adult: Secondary | ICD-10-CM | POA: Insufficient documentation

## 2024-05-25 DIAGNOSIS — B351 Tinea unguium: Secondary | ICD-10-CM

## 2024-05-25 DIAGNOSIS — E782 Mixed hyperlipidemia: Secondary | ICD-10-CM | POA: Insufficient documentation

## 2024-05-25 DIAGNOSIS — Z87891 Personal history of nicotine dependence: Secondary | ICD-10-CM | POA: Insufficient documentation

## 2024-05-25 DIAGNOSIS — I10 Essential (primary) hypertension: Secondary | ICD-10-CM | POA: Insufficient documentation

## 2024-05-25 DIAGNOSIS — M109 Gout, unspecified: Secondary | ICD-10-CM | POA: Insufficient documentation

## 2024-05-25 DIAGNOSIS — E1142 Type 2 diabetes mellitus with diabetic polyneuropathy: Secondary | ICD-10-CM

## 2024-05-25 DIAGNOSIS — M79676 Pain in unspecified toe(s): Secondary | ICD-10-CM | POA: Diagnosis not present

## 2024-05-25 DIAGNOSIS — E559 Vitamin D deficiency, unspecified: Secondary | ICD-10-CM | POA: Insufficient documentation

## 2024-05-25 DIAGNOSIS — G4700 Insomnia, unspecified: Secondary | ICD-10-CM | POA: Insufficient documentation

## 2024-05-25 DIAGNOSIS — G5702 Lesion of sciatic nerve, left lower limb: Secondary | ICD-10-CM | POA: Insufficient documentation

## 2024-05-25 DIAGNOSIS — G4733 Obstructive sleep apnea (adult) (pediatric): Secondary | ICD-10-CM | POA: Insufficient documentation

## 2024-05-25 DIAGNOSIS — N529 Male erectile dysfunction, unspecified: Secondary | ICD-10-CM | POA: Insufficient documentation

## 2024-05-25 NOTE — Progress Notes (Signed)
 Presents today chief complaint of painful elongated toenails.  States that he is getting ready for a cruise on [employer].  Objective: Nails are long thick yellow dystrophic or mycotic pulses are palpable no open lesions or wounds.  Assessment: Pain in limb secondary to onychomycosis.  Plan: Debridement of toenails 1 through 5 bilateral.

## 2024-06-07 DIAGNOSIS — M9903 Segmental and somatic dysfunction of lumbar region: Secondary | ICD-10-CM | POA: Diagnosis not present

## 2024-06-07 DIAGNOSIS — M9902 Segmental and somatic dysfunction of thoracic region: Secondary | ICD-10-CM | POA: Diagnosis not present

## 2024-06-07 DIAGNOSIS — M9905 Segmental and somatic dysfunction of pelvic region: Secondary | ICD-10-CM | POA: Diagnosis not present

## 2024-06-07 DIAGNOSIS — M9904 Segmental and somatic dysfunction of sacral region: Secondary | ICD-10-CM | POA: Diagnosis not present

## 2024-07-12 ENCOUNTER — Ambulatory Visit: Payer: Self-pay | Admitting: Endocrinology

## 2024-07-12 ENCOUNTER — Ambulatory Visit (INDEPENDENT_AMBULATORY_CARE_PROVIDER_SITE_OTHER): Admitting: Endocrinology

## 2024-07-12 ENCOUNTER — Encounter: Payer: Self-pay | Admitting: Endocrinology

## 2024-07-12 VITALS — BP 118/72 | HR 70 | Resp 20 | Ht 76.0 in | Wt 259.4 lb

## 2024-07-12 DIAGNOSIS — E1169 Type 2 diabetes mellitus with other specified complication: Secondary | ICD-10-CM

## 2024-07-12 DIAGNOSIS — E119 Type 2 diabetes mellitus without complications: Secondary | ICD-10-CM | POA: Diagnosis not present

## 2024-07-12 DIAGNOSIS — E7800 Pure hypercholesterolemia, unspecified: Secondary | ICD-10-CM | POA: Diagnosis not present

## 2024-07-12 DIAGNOSIS — Z794 Long term (current) use of insulin: Secondary | ICD-10-CM

## 2024-07-12 DIAGNOSIS — N1831 Chronic kidney disease, stage 3a: Secondary | ICD-10-CM

## 2024-07-12 DIAGNOSIS — E118 Type 2 diabetes mellitus with unspecified complications: Secondary | ICD-10-CM

## 2024-07-12 DIAGNOSIS — R809 Proteinuria, unspecified: Secondary | ICD-10-CM

## 2024-07-12 LAB — POCT GLYCOSYLATED HEMOGLOBIN (HGB A1C): Hemoglobin A1C: 5.6 % (ref 4.0–5.6)

## 2024-07-12 MED ORDER — TIRZEPATIDE 15 MG/0.5ML ~~LOC~~ SOAJ: 15.0000 mg | SUBCUTANEOUS | 4 refills | Status: AC

## 2024-07-12 MED ORDER — SEMGLEE (YFGN) 100 UNIT/ML ~~LOC~~ SOPN
25.0000 [IU] | PEN_INJECTOR | SUBCUTANEOUS | 3 refills | Status: DC
  Filled 2024-09-04: qty 15, 60d supply, fill #0

## 2024-07-12 NOTE — Progress Notes (Signed)
 Outpatient Endocrinology Note [place], MD  07/12/24  Patient's Name: Dennis Sims    DOB: [birth year]    MRN: [id number]                                                    REASON OF VISIT: Follow up of type 2 diabetes mellitus  PCP: Cleotilde Planas, MD  HISTORY OF PRESENT ILLNESS:   Dennis Sims is a 58 y.o. old male with past medical history listed below, is here for follow up for type 2 diabetes mellitus.    Pertinent Diabetes History: Patient was previously seen by Dr. Von and was last time seen in July 2024. Patient was diagnosed with type 2 diabetes mellitus in 2019.  Ozempic  was started in 9/22 Farxiga  was started in 12/2021 BASAL insulin was started in 2020  Chronic Diabetes Complications : Retinopathy: no. Last ophthalmology exam was done on annually, following with ophthalmology regularly.  Nephropathy: yes, microalbuminuria, on ACE/ARB / losartan Peripheral neuropathy: no  Coronary artery disease: no Stroke: no  Relevant comorbidities and cardiovascular risk factors: Obesity: yes Body mass index is 31.58 kg/m.  Hypertension: Yes  Hyperlipidemia : Yes, on statin   Current / Home Diabetic regimen includes:  Semglee  30 units in the morning daily. NovoLog  15 units before dinner as needed, taking rarely. Mounjaro   12.5 mg weekly. Farxiga  10 mg daily.   Prior diabetic medications: Switched from Ozempic  to Mounjaro  in June 2025.  Glycemic data:    CONTINUOUS GLUCOSE MONITORING SYSTEM (CGMS) INTERPRETATION: At today's visit, we reviewed CGM downloads. The full report is scanned in the media. Reviewing the CGM trends, blood glucose are as follows:  FreeStyle Libre 3 CGM-  Sensor Download (Sensor download was reviewed and summarized below.) Dates: July 17 - July 30 , 2025, 14 days Sensor Average: 115 Glucose Management Indicator: 6.1%     Interpretation: Mostly acceptable blood sugar.  No concerning hyperglycemia.  Tend to have low normal blood  sugar with rare blood sugar in upper 60s in the early morning.  Hypoglycemia: Patient has no hypoglycemic episodes. Patient has hypoglycemia awareness.  Factors modifying glucose control: 1.  Diabetic diet assessment: 3 meals a day.  2.  Staying active or exercising: Physically active at work.  Swimming regularly.  3.  Medication compliance: compliant all of the time.  Interval history  CGM data as reviewed above.  He has been taking Mounjaro  and tolerating well.  Hemoglobin A1c 5.6%.  He has been swimming regularly almost every day.  Lost about 25 pounds of weight in the last 2 - 3 months.  No other complaints today.  REVIEW OF SYSTEMS As per history of present illness.   PAST MEDICAL HISTORY: Past Medical History:  Diagnosis Date   Diabetes mellitus without complication (HCC)    Diverticulitis    Hypertension    Plantar fasciitis    Sleep apnea     PAST SURGICAL HISTORY: Past Surgical History:  Procedure Laterality Date   colonscopy     DENTAL SURGERY     FOOT SURGERY      ALLERGIES: Allergies  Allergen Reactions   Codeine  Other (See Comments)   Keflex  [Cephalexin ] Other (See Comments)    Upset stomach, felt overly tired and felt crappy   Oxycodone Rash    ITCHING AND UNABLE TO SLEEP  FAMILY HISTORY:  Family History  Problem Relation Age of Onset   Heart disease Father    Colon cancer Father    Bipolar disorder Brother    Diabetes Neg Hx     SOCIAL HISTORY: Social History   Socioeconomic History   Marital status: Married    Spouse name: Not on file   Number of children: Not on file   Years of education: Not on file   Highest education level: Not on file  Occupational History   Not on file  Tobacco Use   Smoking status: Former    Current packs/day: 0.00    Average packs/day: 1.5 packs/day for 25.0 years (37.5 ttl pk-yrs)    Types: Cigarettes    Start date: 08/08/1991    Quit date: 08/07/2016    Years since quitting: 7.9   Smokeless  tobacco: Never  Substance and Sexual Activity   Alcohol use: Yes    Comment: BEER AND ALCOHOL   Drug use: No   Sexual activity: Not on file  Other Topics Concern   Not on file  Social History Narrative   Own a Patent examiner business.    Social Drivers of Corporate investment banker Strain: Low Risk  (10/19/2022)   Overall Financial Resource Strain (CARDIA)    Difficulty of Paying Living Expenses: Not hard at all  Food Insecurity: No Food Insecurity (10/19/2022)   Hunger Vital Sign    Worried About Running Out of Food in the Last Year: Never true    Ran Out of Food in the Last Year: Never true  Transportation Needs: No Transportation Needs (10/19/2022)   PRAPARE - Administrator, Civil Service (Medical): No    Lack of Transportation (Non-Medical): No  Physical Activity: Not on file  Stress: Not on file  Social Connections: Not on file    MEDICATIONS:  Current Outpatient Medications  Medication Sig Dispense Refill   tirzepatide  (MOUNJARO ) 15 MG/0.5ML Pen Inject 15 mg into the skin once a week. 6 mL 4   atorvastatin  (LIPITOR) 10 MG tablet TAKE 1 TABLET BY MOUTH DAILY 90 tablet 1   bisoprolol  (ZEBETA ) 5 MG tablet Take 5 mg by mouth daily.     Continuous Glucose Sensor (FREESTYLE LIBRE 3 PLUS SENSOR) MISC Inject 1 Device into the skin continuous. Change every 15 days 6 each 3   Continuous Glucose Sensor (FREESTYLE LIBRE 3 PLUS SENSOR) MISC 1 each by Does not apply route continuous. Change every 15 days. 6 each 3   dapagliflozin  propanediol (FARXIGA ) 10 MG TABS tablet Take 1 tablet (10 mg total) by mouth daily before breakfast. 90 tablet 3   insulin aspart  (NOVOLOG  FLEXPEN) 100 UNIT/ML FlexPen Upto 25 U ac tid 15 mL 5   Insulin Pen Needle (B-D UF III MINI PEN NEEDLES) 31G X 5 MM MISC Use to inject once daily 100 each 3   losartan (COZAAR) 50 MG tablet TK 1 T PO QD     Melatonin 10 MG TABS Take 10 mg by mouth at bedtime.     Multiple Vitamin (MULTIVITAMIN WITH MINERALS)  TABS tablet Take 1 tablet by mouth daily.     NEOMYCIN -POLYMYXIN-HYDROCORTISONE (CORTISPORIN) 1 % SOLN OTIC solution Apply 1-2 drops to toe BID after soaking 10 mL 1   OTEZLA 30 MG TABS Take 1 tablet by mouth 2 (two) times daily.     SEMGLEE , YFGN, 100 UNIT/ML Pen Inject 25 Units into the skin every morning. And pen needles 1/day 15 mL  3   tadalafil (CIALIS) 5 MG tablet TK 1 T PO QD PRN     TREMFYA 100 MG/ML SOPN Inject into the skin.     VENTOLIN  HFA 108 (90 Base) MCG/ACT inhaler INHALE 2 PUFFES EVERY 4-6 HOURS FOR COUGH OR SHORTNESS OF BREATH. 18 g 0   No current facility-administered medications for this visit.    PHYSICAL EXAM: Vitals:   07/12/24 1120  BP: 118/72  Pulse: 70  Resp: 20  SpO2: 97%  Weight: 259 lb 6.4 oz (117.7 kg)  Height: 6' 4 (1.93 m)     Body mass index is 31.58 kg/m.  Wt Readings from Last 3 Encounters:  07/12/24 259 lb 6.4 oz (117.7 kg)  02/29/24 286 lb 3.2 oz (129.8 kg)  11/01/23 286 lb 12.8 oz (130.1 kg)    General: Well developed, well nourished male in no apparent distress.  HEENT: AT/[place], no external lesions.  Eyes: Conjunctiva clear and no icterus. Neck: Neck supple  Lungs: Respirations not labored Neurologic: Alert, oriented, normal speech Extremities / Skin: Dry.  Psychiatric: Does not appear depressed or anxious  Diabetic Foot Exam: 11/01/2023  Diabetic Foot Exam - Simple   No data filed    LABS Reviewed Lab Results  Component Value Date   HGBA1C 5.6 07/12/2024   HGBA1C 6.2 (A) 02/29/2024   HGBA1C 6.5 10/26/2023   Lab Results  Component Value Date   FRUCTOSAMINE 353 (H) 05/07/2022   Lab Results  Component Value Date   CHOL 152 07/05/2023   HDL 37.10 (L) 07/05/2023   LDLCALC 95 07/05/2023   TRIG 97.0 07/05/2023   CHOLHDL 4 07/05/2023   No results found for: [facility]  Lab Results  Component Value Date   CREATININE 1.45 10/26/2023   Lab Results  Component Value Date   GFR 52.79 (L) 10/26/2023    ASSESSMENT /  PLAN  1. Type 2 diabetes mellitus with other specified complication, with long-term current use of insulin (HCC)   2. Controlled type 2 diabetes mellitus with complication, with long-term current use of insulin (HCC)   3. Hypercholesterolemia    Diabetes Mellitus type 2, complicated by CKD/microalbuminuria - Diabetic status / severity: Controlled.  Lab Results  Component Value Date   HGBA1C 5.6 07/12/2024    - Hemoglobin A1c goal : <6.5%  Adjusted diabetes regimen as follows.  - Medications:  Decrease MAP from 30 to 25 units daily in the morning.  Okay to take NovoLog  as needed for hyperglycemia about 5 to 10 units at a time.  Increase Mounjaro  from 12.5 to 15 mg weekly..  Farxiga  10 mg daily.  Patient is not sure if he is actually taking Farxiga , he will confirm and let us  know.   - Home glucose testing: Freestyle libre/CGM and check as needed.  - Discussed/ Gave Hypoglycemia treatment plan.  # Consult : not required at this time.   # Annual urine for microalbuminuria/ creatinine ratio, microalbuminuria currently, continue ACE/ARB /losartan/Farxiga .  Has microalbuminuria.  Not seeing nephrology. Last  No results found for: MICRALBCREAT  Will check urine microalbumin creatinine ratio and BMP with EGFR today.   # Foot check nightly.  # Annual dilated diabetic eye exams.   - Diet: Make healthy diabetic food choices - Life style / activity / exercise: Discussed.  2. Blood pressure  -  BP Readings from Last 1 Encounters:  07/12/24 118/72    - Control is in target.  - No change in current plans.  3. Lipid status / Hyperlipidemia -  Last  Lab Results  Component Value Date   LDLCALC 95 07/05/2023   - Continue atorvastatin  10 mg daily. - Check lipid panel today.  Diagnoses and all orders for this visit:  Type 2 diabetes mellitus with other specified complication, with long-term current use of insulin (HCC) -     POCT glycosylated hemoglobin (Hb A1C) -      tirzepatide  (MOUNJARO ) 15 MG/0.5ML Pen; Inject 15 mg into the skin once a week. -     Microalbumin / creatinine urine ratio -     Basic metabolic panel with GFR  Controlled type 2 diabetes mellitus with complication, with long-term current use of insulin (HCC) -     SEMGLEE , YFGN, 100 UNIT/ML Pen; Inject 25 Units into the skin every morning. And pen needles 1/day  Hypercholesterolemia -     Lipid panel   DISPOSITION Follow up in clinic in 4 months suggested.   All questions answered and patient verbalized understanding of the plan.  [place] Kristyl [place], MD [facility] Endocrinology [facility] [address] [place], [address] [place], [place] [address] Phone # [phone]  At least part of this note was generated using voice recognition software. Inadvertent word errors may have occurred, which were not recognized during the proofreading process.

## 2024-07-12 NOTE — Patient Instructions (Signed)
 Latest Reference Range & Units 10/26/23 09:24 02/29/24 08:38 07/12/24 11:27  Hemoglobin A1C 4.0 - 5.6 % 6.5 6.2 ! Pend 5.6  !: Data is abnormal

## 2024-07-13 LAB — MICROALBUMIN / CREATININE URINE RATIO
Creatinine, Urine: 125 mg/dL (ref 20–320)
Microalb Creat Ratio: 686 mg/g{creat} — ABNORMAL HIGH (ref <30)
Microalb, Ur: 85.7 mg/dL

## 2024-07-13 LAB — BASIC METABOLIC PANEL WITH GFR
BUN/Creatinine Ratio: 17 (calc) (ref 6–22)
BUN: 24 mg/dL (ref 7–25)
CO2: 28 mmol/L (ref 20–32)
Calcium: 9.8 mg/dL (ref 8.6–10.3)
Chloride: 104 mmol/L (ref 98–110)
Creat: 1.4 mg/dL — ABNORMAL HIGH (ref 0.70–1.35)
Glucose, Bld: 90 mg/dL (ref 65–99)
Potassium: 4.6 mmol/L (ref 3.5–5.3)
Sodium: 140 mmol/L (ref 135–146)
eGFR: 58 mL/min/1.73m2 — ABNORMAL LOW (ref >=60)

## 2024-07-13 LAB — LIPID PANEL
Cholesterol: 146 mg/dL (ref <200)
HDL: 41 mg/dL (ref >=40)
LDL Cholesterol (Calc): 86 mg/dL
Non-HDL Cholesterol (Calc): 105 mg/dL (ref <130)
Total CHOL/HDL Ratio: 3.6 (calc) (ref <5.0)
Triglycerides: 93 mg/dL (ref <150)

## 2024-07-14 ENCOUNTER — Encounter: Payer: Self-pay | Admitting: Endocrinology

## 2024-07-14 DIAGNOSIS — S46812A Strain of other muscles, fascia and tendons at shoulder and upper arm level, left arm, initial encounter: Secondary | ICD-10-CM | POA: Diagnosis not present

## 2024-07-18 DIAGNOSIS — S4992XA Unspecified injury of left shoulder and upper arm, initial encounter: Secondary | ICD-10-CM | POA: Diagnosis not present

## 2024-07-18 DIAGNOSIS — M25512 Pain in left shoulder: Secondary | ICD-10-CM | POA: Diagnosis not present

## 2024-07-20 DIAGNOSIS — M25512 Pain in left shoulder: Secondary | ICD-10-CM | POA: Diagnosis not present

## 2024-07-27 DIAGNOSIS — S4992XD Unspecified injury of left shoulder and upper arm, subsequent encounter: Secondary | ICD-10-CM | POA: Diagnosis not present

## 2024-07-28 DIAGNOSIS — M25512 Pain in left shoulder: Secondary | ICD-10-CM | POA: Diagnosis not present

## 2024-07-31 DIAGNOSIS — M25512 Pain in left shoulder: Secondary | ICD-10-CM | POA: Diagnosis not present

## 2024-08-03 DIAGNOSIS — M25512 Pain in left shoulder: Secondary | ICD-10-CM | POA: Diagnosis not present

## 2024-08-08 DIAGNOSIS — M25512 Pain in left shoulder: Secondary | ICD-10-CM | POA: Diagnosis not present

## 2024-08-15 DIAGNOSIS — M25512 Pain in left shoulder: Secondary | ICD-10-CM | POA: Diagnosis not present

## 2024-08-18 DIAGNOSIS — M25512 Pain in left shoulder: Secondary | ICD-10-CM | POA: Diagnosis not present

## 2024-08-23 DIAGNOSIS — N1831 Chronic kidney disease, stage 3a: Secondary | ICD-10-CM | POA: Diagnosis not present

## 2024-08-23 DIAGNOSIS — I129 Hypertensive chronic kidney disease with stage 1 through stage 4 chronic kidney disease, or unspecified chronic kidney disease: Secondary | ICD-10-CM | POA: Diagnosis not present

## 2024-08-23 DIAGNOSIS — E785 Hyperlipidemia, unspecified: Secondary | ICD-10-CM | POA: Diagnosis not present

## 2024-08-23 DIAGNOSIS — E1122 Type 2 diabetes mellitus with diabetic chronic kidney disease: Secondary | ICD-10-CM | POA: Diagnosis not present

## 2024-08-25 ENCOUNTER — Other Ambulatory Visit: Payer: Self-pay | Admitting: Nephrology

## 2024-08-25 ENCOUNTER — Ambulatory Visit
Admission: RE | Admit: 2024-08-25 | Discharge: 2024-08-25 | Disposition: A | Discharge status: Home / Self Care | Source: Ambulatory Visit | Attending: Nephrology | Admitting: Nephrology

## 2024-08-25 DIAGNOSIS — N1831 Chronic kidney disease, stage 3a: Secondary | ICD-10-CM | POA: Diagnosis not present

## 2024-08-25 DIAGNOSIS — N281 Cyst of kidney, acquired: Secondary | ICD-10-CM | POA: Diagnosis not present

## 2024-08-29 DIAGNOSIS — L400 Psoriasis vulgaris: Secondary | ICD-10-CM | POA: Diagnosis not present

## 2024-08-29 DIAGNOSIS — L820 Inflamed seborrheic keratosis: Secondary | ICD-10-CM | POA: Diagnosis not present

## 2024-08-29 DIAGNOSIS — Z79899 Other long term (current) drug therapy: Secondary | ICD-10-CM | POA: Diagnosis not present

## 2024-08-29 DIAGNOSIS — L918 Other hypertrophic disorders of the skin: Secondary | ICD-10-CM | POA: Diagnosis not present

## 2024-09-04 ENCOUNTER — Encounter: Payer: Self-pay | Admitting: Physical Therapy

## 2024-09-04 ENCOUNTER — Other Ambulatory Visit (HOSPITAL_BASED_OUTPATIENT_CLINIC_OR_DEPARTMENT_OTHER): Payer: Self-pay

## 2024-09-04 ENCOUNTER — Other Ambulatory Visit: Payer: Self-pay

## 2024-09-04 ENCOUNTER — Ambulatory Visit: Attending: Orthopedic Surgery | Admitting: Physical Therapy

## 2024-09-04 DIAGNOSIS — M25512 Pain in left shoulder: Secondary | ICD-10-CM | POA: Insufficient documentation

## 2024-09-04 NOTE — Therapy (Signed)
 OUTPATIENT PHYSICAL THERAPY SHOULDER EVALUATION   Patient Name: Dennis Sims MRN: [id number] DOB:20-Dec-1965, 58 y.o., male Today's Date: 09/04/2024   PT End of Session - 09/04/24 1239     Visit Number 1    PT Start Time 1155    PT Stop Time 1230    PT Time Calculation (min) 35 min          Past Medical History:  Diagnosis Date   Diabetes mellitus without complication (HCC)    Diverticulitis    Hypertension    Plantar fasciitis    Sleep apnea    Past Surgical History:  Procedure Laterality Date   colonscopy     DENTAL SURGERY     FOOT SURGERY     Patient Active Problem List   Diagnosis Date Noted   ED (erectile dysfunction) 05/25/2024   Gout 05/25/2024   History of tobacco use 05/25/2024   Hypertension 05/25/2024   Insomnia 05/25/2024   Mild intermittent asthma without complication 05/25/2024   Mixed hyperlipidemia 05/25/2024   Morbid obesity (HCC) 05/25/2024   BMI 37.0-37.9, adult 05/25/2024   Obstructive sleep apnea syndrome 05/25/2024   Piriformis syndrome of left side 05/25/2024   Vitamin D deficiency 05/25/2024   Stage 3a chronic kidney disease (CKD) (HCC) 05/04/2023   Psoriasis 09/01/2021   Type 2 DM with CKD stage 3 and hypertension (HCC) 09/01/2021   Chronic cough 09/22/2016   Moderate persistent asthma without complication 09/22/2016   Essential hypertension, benign 09/22/2016    PCP: Cleotilde Planas, MD  REFERRING PROVIDER: Kay Kemps, MD  THERAPY DIAG:  Left shoulder pain, unspecified chronicity  REFERRING DIAG: Pain in left shoulder [M25.512]   Rationale for Evaluation and Treatment:  Rehabilitation  SUBJECTIVE:  PERTINENT PAST HISTORY:  Asthma, psoriasis, CKD, diabetes      PRECAUTIONS: None  WEIGHT BEARING RESTRICTIONS No  FALLS:  Has patient fallen in last 6 months? No, Number of falls: 0  MOI/History of condition:  Onset date: ~ 2 months  SUBJECTIVE STATEMENT  Pt is a 58 y.o. male who presents to clinic with chief  complaint of L shoulder pain.  He swims daily and hurt his shoulder completing a butterfly stroke.  MRI (-).  He has gone to PT and had good progress.  Continues to feel weak. With lateral raises.  Denies n/t in L UE.  Pain:  Are you having pain? Yes Pain location: L shoulder NPRS scale:  Best: 3/10, Worst: 5/10 Aggravating factors: lifting, reaching, swimming Relieving factors: rest Pain description: aching  Occupation: Minimal physical work  Assistive Device: na  Higher education careers adviser Dominance: R  Patient Goals/Specific Activities: get back to swimming   OBJECTIVE:    UPPER EXTREMITY AROM:  ROM Right (Eval) Left (Eval)  Shoulder flexion N N*  Shoulder abduction N N*  Shoulder internal rotation    Shoulder external rotation    Functional IR N N*  Functional ER n n  Shoulder extension    Elbow extension    Elbow flexion     (Blank rows = not tested, N = WNL, * = concordant pain with testing)  UPPER EXTREMITY MMT:  MMT Right (Eval) Left (Eval)  Shoulder flexion 36 27  Shoulder abduction (C5)    Shoulder ER WNL Painful, 4/5  Shoulder IR    Middle trapezius    Lower trapezius    Shoulder extension    Grip strength    Cervical flexion (C1,C2)    Cervical S/B (C3)    Shoulder shrug (C4)  Elbow flexion (C6)    Elbow ext (C7)    Thumb ext (C8)    Finger abd (T1)    Grossly     (Blank rows = not tested, score listed is out of 5 possible points.  N = WNL, D = diminished, C = clear for gross weakness with myotome testing, * = concordant pain with testing)   UPPER EXTREMITY PROM:  PROM Right (Eval) Left (Eval)  Shoulder flexion    Shoulder abduction    Shoulder internal rotation    Shoulder external rotation    Functional IR    Functional ER    Shoulder extension    Elbow extension    Elbow flexion     (Blank rows = not tested, N = WNL, * = concordant pain with testing)     TODAY'S TREATMENT:  Therapeutic Exercise: Creating, reviewing, and completing below  HEP   PATIENT EDUCATION ([place]/HM):  POC, diagnosis, progressive overload, isolated strengthening vs sport, return to sport.  Pt educated via explanation.  Pt confirms understanding verbally.     ASSESSMENT:  CLINICAL IMPRESSION: Discussed injury and prognosis with pt.  Pt requesting aquatic therapy.  Explained that at this point it would be more beneficial to strengthen the shoulder in land based therapy and then work with PT for guidance on return to sport.  Pt previously seen by PT with high level swimming experience and was seeing progress.  After discussion decided that he would continue with this PT.  OBJECTIVE IMPAIRMENTS: Pain  ACTIVITY LIMITATIONS: swimming  PERSONAL FACTORS: See medical history and pertinent history   REHAB POTENTIAL: Good  CLINICAL DECISION MAKING: Evolving/moderate complexity  EVALUATION COMPLEXITY: Moderate   GOALS:  1x visit  PLAN: PT FREQUENCY: 1x visit   Helene Gasmen PT, DPT 09/04/2024, 12:40 PM

## 2024-09-05 ENCOUNTER — Other Ambulatory Visit: Payer: Self-pay

## 2024-09-05 DIAGNOSIS — S4992XD Unspecified injury of left shoulder and upper arm, subsequent encounter: Secondary | ICD-10-CM | POA: Diagnosis not present

## 2024-09-11 ENCOUNTER — Other Ambulatory Visit: Payer: Self-pay

## 2024-09-18 DIAGNOSIS — M25512 Pain in left shoulder: Secondary | ICD-10-CM | POA: Diagnosis not present

## 2024-09-26 DIAGNOSIS — M25512 Pain in left shoulder: Secondary | ICD-10-CM | POA: Diagnosis not present

## 2024-10-02 DIAGNOSIS — E1122 Type 2 diabetes mellitus with diabetic chronic kidney disease: Secondary | ICD-10-CM | POA: Diagnosis not present

## 2024-10-02 DIAGNOSIS — E782 Mixed hyperlipidemia: Secondary | ICD-10-CM | POA: Diagnosis not present

## 2024-10-02 DIAGNOSIS — N529 Male erectile dysfunction, unspecified: Secondary | ICD-10-CM | POA: Diagnosis not present

## 2024-10-02 DIAGNOSIS — Z0000 Encounter for general adult medical examination without abnormal findings: Secondary | ICD-10-CM | POA: Diagnosis not present

## 2024-10-02 DIAGNOSIS — Z125 Encounter for screening for malignant neoplasm of prostate: Secondary | ICD-10-CM | POA: Diagnosis not present

## 2024-10-02 DIAGNOSIS — E669 Obesity, unspecified: Secondary | ICD-10-CM | POA: Diagnosis not present

## 2024-10-16 ENCOUNTER — Telehealth: Payer: Self-pay

## 2024-10-16 ENCOUNTER — Other Ambulatory Visit (HOSPITAL_COMMUNITY): Payer: Self-pay

## 2024-10-16 DIAGNOSIS — M25512 Pain in left shoulder: Secondary | ICD-10-CM | POA: Diagnosis not present

## 2024-10-16 NOTE — Telephone Encounter (Signed)
 Pharmacy Patient Advocate Encounter   Received notification from CoverMyMeds that prior authorization for FreeStyle Libre 3 Plus Sensor is required/requested.   Insurance verification completed.   The patient is insured through [facility].   Per test claim: PA required and submitted KEY/EOC/Request #: [id number] due to Prior Authorization Not Required

## 2024-10-18 ENCOUNTER — Other Ambulatory Visit (HOSPITAL_BASED_OUTPATIENT_CLINIC_OR_DEPARTMENT_OTHER): Payer: Self-pay

## 2024-10-31 DIAGNOSIS — M25512 Pain in left shoulder: Secondary | ICD-10-CM | POA: Diagnosis not present

## 2024-11-05 DIAGNOSIS — J40 Bronchitis, not specified as acute or chronic: Secondary | ICD-10-CM | POA: Diagnosis not present

## 2024-11-05 DIAGNOSIS — J329 Chronic sinusitis, unspecified: Secondary | ICD-10-CM | POA: Diagnosis not present

## 2024-11-13 ENCOUNTER — Encounter: Payer: Self-pay | Admitting: Endocrinology

## 2024-11-13 ENCOUNTER — Ambulatory Visit (INDEPENDENT_AMBULATORY_CARE_PROVIDER_SITE_OTHER): Admitting: Endocrinology

## 2024-11-13 ENCOUNTER — Ambulatory Visit: Payer: Self-pay | Admitting: Endocrinology

## 2024-11-13 VITALS — BP 120/64 | HR 64 | Resp 16 | Ht 76.0 in | Wt 254.0 lb

## 2024-11-13 DIAGNOSIS — Z794 Long term (current) use of insulin: Secondary | ICD-10-CM

## 2024-11-13 DIAGNOSIS — E118 Type 2 diabetes mellitus with unspecified complications: Secondary | ICD-10-CM | POA: Diagnosis not present

## 2024-11-13 DIAGNOSIS — E1169 Type 2 diabetes mellitus with other specified complication: Secondary | ICD-10-CM | POA: Diagnosis not present

## 2024-11-13 LAB — POCT GLYCOSYLATED HEMOGLOBIN (HGB A1C): Hemoglobin A1C: 5.7 % — AB (ref 4.0–5.6)

## 2024-11-13 MED ORDER — NOVOLOG FLEXPEN 100 UNIT/ML ~~LOC~~ SOPN: 0.0000 [IU] | PEN_INJECTOR | Freq: Three times a day (TID) | SUBCUTANEOUS | 5 refills | Status: AC

## 2024-11-13 MED ORDER — DAPAGLIFLOZIN PROPANEDIOL 10 MG PO TABS: 10.0000 mg | ORAL_TABLET | Freq: Every day | ORAL | 3 refills | Status: AC

## 2024-11-13 MED ORDER — FREESTYLE LIBRE 3 PLUS SENSOR MISC: 1.0000 | 3 refills | Status: AC

## 2024-11-13 NOTE — Patient Instructions (Signed)
 Mounjaro  15 mg weekly.  Farxiga  10 mg daily.  Stay on Novolog  as needed.  Sliding Scale Blood Glucose        Insulin  60-150                     None 151-200                   None 201-250                   1 units 251-300                   3 units 301-350                   5 units 351-400                   7 units      >400                        9 units and call provider

## 2024-11-13 NOTE — Progress Notes (Signed)
 Outpatient Endocrinology Note Ebony Rickel, MD  11/13/24  Patient's Name: Dennis Sims    DOB: 1966-11-08    MRN: [id number]                                                    REASON OF VISIT: Follow up of type 2 diabetes mellitus  PCP: Cleotilde Planas, MD  HISTORY OF PRESENT ILLNESS:   JASKARAN DAUZAT is a 58 y.o. old male with past medical history listed below, is here for follow up for type 2 diabetes mellitus.    Pertinent Diabetes History: Patient was previously seen by Dr. Von and was last time seen in July 2024. Patient was diagnosed with type 2 diabetes mellitus in 2019.  Ozempic  was started in 9/22 Farxiga  was started in 12/2021 BASAL insulin  was started in 2020  Chronic Diabetes Complications : Retinopathy: no. Last ophthalmology exam was done on annually, following with ophthalmology regularly.  Nephropathy: yes, microalbuminuria, on ACE/ARB / losartan /Farxiga . Peripheral neuropathy: no  Coronary artery disease: no Stroke: no  Relevant comorbidities and cardiovascular risk factors: Obesity: yes Body mass index is 30.92 kg/m.  Hypertension: Yes  Hyperlipidemia : Yes, on statin   Current / Home Diabetic regimen includes:  NovoLog  as needed, taking occasionally. Mounjaro   15mg  weekly. Farxiga  10 mg daily.   Prior diabetic medications: Switched from Ozempic  to Mounjaro  in June 2025.  Stopped Semglee  after improvement of diabetes control.  Glycemic data:    CONTINUOUS GLUCOSE MONITORING SYSTEM (CGMS) INTERPRETATION: At today's visit, we reviewed CGM downloads. The full report is scanned in the media. Reviewing the CGM trends, blood glucose are as follows:  FreeStyle Libre 3 CGM-  Sensor Download (Sensor download was reviewed and summarized below.) Dates: November 18 to November 13, 2024, 14 days Sensor Average: 118 Glucose Management Indicator: 6.1%      Interpretation: Almost all acceptable blood sugar.  Rare mild hyperglycemia with blood sugar  180 postprandially.  No concerning hypoglycemia.  Acceptable blood sugar overnight and in between the meals.  Patient has not been taking basal insulin .  Hypoglycemia: Patient has no hypoglycemic episodes. Patient has hypoglycemia awareness.  Factors modifying glucose control: 1.  Diabetic diet assessment: 3 meals a day.  2.  Staying active or exercising: Physically active at work.  Swimming regularly.  3.  Medication compliance: compliant all of the time.  Interval history  Hemoglobin A1c 5.7% today.  He stopped taking Semeglee / basal insulin  to 3 months ago, due to hypoglycemia.  He takes NovoLog  as needed.  Tolerating Mounjaro  well.  He lost about 32 pounds of weight in last 6 months and 5 pounds of weight in last 3 to 4 months.  No numbness and ting of the feet.  No vision problem.  No other complaints today.   REVIEW OF SYSTEMS As per history of present illness.   PAST MEDICAL HISTORY: Past Medical History:  Diagnosis Date   Diabetes mellitus without complication (HCC)    Diverticulitis    Hypertension    Plantar fasciitis    Sleep apnea     PAST SURGICAL HISTORY: Past Surgical History:  Procedure Laterality Date   colonscopy     DENTAL SURGERY     FOOT SURGERY      ALLERGIES: Allergies  Allergen Reactions   Codeine  Other (See Comments)  Keflex  [Cephalexin ] Other (See Comments)    Upset stomach, felt overly tired and felt crappy   Oxycodone Rash    ITCHING AND UNABLE TO SLEEP        FAMILY HISTORY:  Family History  Problem Relation Age of Onset   Heart disease Father    Colon cancer Father    Bipolar disorder Brother    Diabetes Neg Hx     SOCIAL HISTORY: Social History   Socioeconomic History   Marital status: Married    Spouse name: Not on file   Number of children: Not on file   Years of education: Not on file   Highest education level: Not on file  Occupational History   Not on file  Tobacco Use   Smoking status: Former    Current  packs/day: 0.00    Average packs/day: 1.5 packs/day for 25.0 years (37.5 ttl pk-yrs)    Types: Cigarettes    Start date: 08/08/1991    Quit date: 08/07/2016    Years since quitting: 8.2   Smokeless tobacco: Never  Substance and Sexual Activity   Alcohol use: Yes    Comment: BEER AND ALCOHOL   Drug use: No   Sexual activity: Not on file  Other Topics Concern   Not on file  Social History Narrative   Own a patent examiner business.    Social Drivers of Corporate Investment Banker Strain: Low Risk  (10/19/2022)   Overall Financial Resource Strain (CARDIA)    Difficulty of Paying Living Expenses: Not hard at all  Food Insecurity: No Food Insecurity (10/19/2022)   Hunger Vital Sign    Worried About Running Out of Food in the Last Year: Never true    Ran Out of Food in the Last Year: Never true  Transportation Needs: No Transportation Needs (10/19/2022)   PRAPARE - Administrator, Civil Service (Medical): No    Lack of Transportation (Non-Medical): No  Physical Activity: Not on file  Stress: Not on file  Social Connections: Not on file    MEDICATIONS:  Current Outpatient Medications  Medication Sig Dispense Refill   atorvastatin  (LIPITOR) 10 MG tablet TAKE 1 TABLET BY MOUTH DAILY 90 tablet 1   bisoprolol  (ZEBETA ) 5 MG tablet Take 5 mg by mouth daily.     Continuous Glucose Sensor (FREESTYLE LIBRE 3 PLUS SENSOR) MISC Inject 1 Device into the skin continuous. Change every 15 days 6 each 3   Insulin  Pen Needle (B-D UF III MINI PEN NEEDLES) 31G X 5 MM MISC Use to inject once daily 100 each 3   losartan (COZAAR) 50 MG tablet TK 1 T PO QD     Melatonin 10 MG TABS Take 10 mg by mouth at bedtime.     Multiple Vitamin (MULTIVITAMIN WITH MINERALS) TABS tablet Take 1 tablet by mouth daily.     NEOMYCIN -POLYMYXIN-HYDROCORTISONE (CORTISPORIN) 1 % SOLN OTIC solution Apply 1-2 drops to toe BID after soaking 10 mL 1   OTEZLA 30 MG TABS Take 1 tablet by mouth 2 (two) times daily.      tadalafil (CIALIS) 5 MG tablet TK 1 T PO QD PRN     tirzepatide  (MOUNJARO ) 15 MG/0.5ML Pen Inject 15 mg into the skin once a week. 6 mL 4   TREMFYA 100 MG/ML SOPN Inject into the skin.     VENTOLIN  HFA 108 (90 Base) MCG/ACT inhaler INHALE 2 PUFFES EVERY 4-6 HOURS FOR COUGH OR SHORTNESS OF BREATH. 18 g 0  Continuous Glucose Sensor (FREESTYLE LIBRE 3 PLUS SENSOR) MISC 1 each by Does not apply route continuous. Change every 15 days. 6 each 3   dapagliflozin  propanediol (FARXIGA ) 10 MG TABS tablet Take 1 tablet (10 mg total) by mouth daily before breakfast. 90 tablet 3   insulin  aspart (NOVOLOG  FLEXPEN) 100 UNIT/ML FlexPen Inject 0-9 Units into the skin 3 (three) times daily with meals. As needed based on sliding scale provided. 15 mL 5   No current facility-administered medications for this visit.    PHYSICAL EXAM: Vitals:   11/13/24 1120  BP: 120/64  Pulse: 64  Resp: 16  SpO2: 98%  Weight: 254 lb (115.2 kg)  Height: 6' 4 (1.93 m)     Body mass index is 30.92 kg/m.  Wt Readings from Last 3 Encounters:  11/13/24 254 lb (115.2 kg)  07/12/24 259 lb 6.4 oz (117.7 kg)  02/29/24 286 lb 3.2 oz (129.8 kg)    General: Well developed, well nourished male in no apparent distress.  HEENT: AT/[place], no external lesions.  Eyes: Conjunctiva clear and no icterus. Neck: Neck supple  Lungs: Respirations not labored Neurologic: Alert, oriented, normal speech Extremities / Skin: Dry.  Psychiatric: Does not appear depressed or anxious  Diabetic Foot Exam: 11/01/2023  Diabetic Foot Exam - Simple   Simple Foot Form Diabetic Foot exam was performed with the following findings: Yes 11/13/2024 11:39 AM  Visual Inspection No deformities, no ulcerations, no other skin breakdown bilaterally: Yes Sensation Testing Intact to touch and monofilament testing bilaterally: Yes Pulse Check Comments    LABS Reviewed Lab Results  Component Value Date   HGBA1C 5.7 (A) 11/13/2024   HGBA1C 5.6 07/12/2024    HGBA1C 6.2 (A) 02/29/2024   Lab Results  Component Value Date   FRUCTOSAMINE 353 (H) 05/07/2022   Lab Results  Component Value Date   CHOL 146 07/12/2024   HDL 41 07/12/2024   LDLCALC 86 07/12/2024   TRIG 93 07/12/2024   CHOLHDL 3.6 07/12/2024   Lab Results  Component Value Date   MICRALBCREAT 686 (H) 07/12/2024    Lab Results  Component Value Date   CREATININE 1.40 (H) 07/12/2024   Lab Results  Component Value Date   GFR 52.79 (L) 10/26/2023    ASSESSMENT / PLAN  1. Type 2 diabetes mellitus with other specified complication, with long-term current use of insulin  (HCC)   2. Controlled type 2 diabetes mellitus with complication, with long-term current use of insulin  (HCC)    Diabetes Mellitus type 2, complicated by CKD/microalbuminuria - Diabetic status / severity: Controlled.  Lab Results  Component Value Date   HGBA1C 5.7 (A) 11/13/2024    - Hemoglobin A1c goal : <6.5%  Adjusted diabetes regimen as follows.  Okay not to take basal insulin .  Okay to continue NovoLog  as needed based on sliding scale.  - Medications:  Continue Mounjaro  15 mg weekly. Continue Farxiga  10 mg daily. Stay on Novolog  as needed.  Sliding Scale Blood Glucose        Insulin  60-150                     None 151-200                   None 201-250                   1 units 251-300  3 units 301-350                   5 units 351-400                   7 units      >400                        9 units and call provider   - Home glucose testing: Freestyle libre/CGM and check as needed.  - Discussed/ Gave Hypoglycemia treatment plan.  # Consult : not required at this time.   # Annual urine for microalbuminuria/ creatinine ratio, + microalbuminuria currently, continue ACE/ARB /losartan/Farxiga .  Has microalbuminuria.  Referred to nephrology in last visit. Last  Lab Results  Component Value Date   MICRALBCREAT 686 (H) 07/12/2024    # Foot check nightly.  # Annual  dilated diabetic eye exams.   - Diet: Make healthy diabetic food choices - Life style / activity / exercise: Discussed.  2. Blood pressure  -  BP Readings from Last 1 Encounters:  11/13/24 120/64    - Control is in target.  - No change in current plans.  3. Lipid status / Hyperlipidemia - Last  Lab Results  Component Value Date   LDLCALC 86 07/12/2024   - Continue atorvastatin  10 mg daily.  Diagnoses and all orders for this visit:  Type 2 diabetes mellitus with other specified complication, with long-term current use of insulin  (HCC) -     POCT glycosylated hemoglobin (Hb A1C) -     dapagliflozin  propanediol (FARXIGA ) 10 MG TABS tablet; Take 1 tablet (10 mg total) by mouth daily before breakfast. -     Basic metabolic panel with GFR -     Microalbumin / creatinine urine ratio -     Lipid panel -     Hemoglobin A1c -     Continuous Glucose Sensor (FREESTYLE LIBRE 3 PLUS SENSOR) MISC; 1 each by Does not apply route continuous. Change every 15 days.  Controlled type 2 diabetes mellitus with complication, with long-term current use of insulin  (HCC) -     insulin  aspart (NOVOLOG  FLEXPEN) 100 UNIT/ML FlexPen; Inject 0-9 Units into the skin 3 (three) times daily with meals. As needed based on sliding scale provided.   DISPOSITION Follow up in clinic in 4 months suggested.  Labs prior to follow-up visit as ordered.   All questions answered and patient verbalized understanding of the plan.  Lucette Kratz, MD [facility] Endocrinology [facility] [address] [place], [address] [place], [place] [address] Phone # [phone]  At least part of this note was generated using voice recognition software. Inadvertent word errors may have occurred, which were not recognized during the proofreading process.

## 2024-11-14 DIAGNOSIS — M25512 Pain in left shoulder: Secondary | ICD-10-CM | POA: Diagnosis not present

## 2025-03-12 ENCOUNTER — Other Ambulatory Visit

## 2025-03-14 ENCOUNTER — Ambulatory Visit: Admitting: Endocrinology

## 2025-03-20 ENCOUNTER — Ambulatory Visit: Admitting: Endocrinology
